# Patient Record
Sex: Male | Born: 1965 | Race: White | Hispanic: No | Marital: Married | Smoking: Former smoker
Health system: Southern US, Community
[De-identification: ages and names within clinical notes are randomized; demographics above are authoritative.]

## PROBLEM LIST (undated history)

## (undated) DIAGNOSIS — M1990 Unspecified osteoarthritis, unspecified site: Secondary | ICD-10-CM

## (undated) DIAGNOSIS — K219 Gastro-esophageal reflux disease without esophagitis: Secondary | ICD-10-CM

## (undated) DIAGNOSIS — D649 Anemia, unspecified: Secondary | ICD-10-CM

## (undated) DIAGNOSIS — R7303 Prediabetes: Secondary | ICD-10-CM

## (undated) DIAGNOSIS — I10 Essential (primary) hypertension: Secondary | ICD-10-CM

## (undated) HISTORY — PX: TONSILLECTOMY: SUR1361

## (undated) HISTORY — DX: Unspecified osteoarthritis, unspecified site: M19.90

## (undated) HISTORY — DX: Essential (primary) hypertension: I10

## (undated) HISTORY — PX: ADENOIDECTOMY: SUR15

## (undated) HISTORY — PX: FINGER AMPUTATION: SHX636

## (undated) HISTORY — DX: Gastro-esophageal reflux disease without esophagitis: K21.9

---

## 2000-11-29 ENCOUNTER — Emergency Department (HOSPITAL_COMMUNITY): Admission: EM | Admit: 2000-11-29 | Discharge: 2000-11-29 | Payer: Self-pay | Admitting: Internal Medicine

## 2011-12-17 ENCOUNTER — Ambulatory Visit: Payer: Federal, State, Local not specified - PPO

## 2011-12-17 ENCOUNTER — Ambulatory Visit (INDEPENDENT_AMBULATORY_CARE_PROVIDER_SITE_OTHER): Payer: Federal, State, Local not specified - PPO | Admitting: Family Medicine

## 2011-12-17 VITALS — BP 142/95 | HR 80 | Temp 98.9°F | Resp 18 | Ht 73.75 in | Wt 321.0 lb

## 2011-12-17 DIAGNOSIS — J9801 Acute bronchospasm: Secondary | ICD-10-CM

## 2011-12-17 DIAGNOSIS — R062 Wheezing: Secondary | ICD-10-CM

## 2011-12-17 DIAGNOSIS — R05 Cough: Secondary | ICD-10-CM

## 2011-12-17 DIAGNOSIS — D7289 Other specified disorders of white blood cells: Secondary | ICD-10-CM

## 2011-12-17 DIAGNOSIS — J40 Bronchitis, not specified as acute or chronic: Secondary | ICD-10-CM

## 2011-12-17 LAB — POCT CBC
Lymph, poc: 2 (ref 0.6–3.4)
MCHC: 32.1 g/dL (ref 31.8–35.4)
MPV: 8.3 fL (ref 0–99.8)
POC Granulocyte: 11 — AB (ref 2–6.9)
POC LYMPH PERCENT: 14.3 %L (ref 10–50)
POC MID %: 5.3 %M (ref 0–12)
Platelet Count, POC: 360 10*3/uL (ref 142–424)
RDW, POC: 13.4 %

## 2011-12-17 MED ORDER — AZITHROMYCIN 250 MG PO TABS: ORAL_TABLET | ORAL | Status: DC

## 2011-12-17 MED ORDER — HYDROCODONE-HOMATROPINE 5-1.5 MG/5ML PO SYRP: ORAL_SOLUTION | ORAL | Status: AC

## 2011-12-17 MED ORDER — IPRATROPIUM BROMIDE 0.02 % IN SOLN
0.5000 mg | Freq: Once | RESPIRATORY_TRACT | Status: AC
  Administered 2011-12-17: 0.5 mg via RESPIRATORY_TRACT

## 2011-12-17 MED ORDER — ALBUTEROL SULFATE (2.5 MG/3ML) 0.083% IN NEBU
2.5000 mg | INHALATION_SOLUTION | Freq: Once | RESPIRATORY_TRACT | Status: AC
  Administered 2011-12-17: 2.5 mg via RESPIRATORY_TRACT

## 2011-12-17 MED ORDER — ALBUTEROL SULFATE HFA 108 (90 BASE) MCG/ACT IN AERS: 2.0000 | INHALATION_SPRAY | RESPIRATORY_TRACT | Status: DC | PRN

## 2011-12-17 MED ORDER — BECLOMETHASONE DIPROPIONATE 40 MCG/ACT IN AERS: 1.0000 | INHALATION_SPRAY | Freq: Two times a day (BID) | RESPIRATORY_TRACT | Status: DC

## 2011-12-17 NOTE — Progress Notes (Signed)
  Subjective:    Patient ID: George Hays, male    DOB: October 09, 1965, 46 y.o.   MRN: [id number]  HPI    Review of Systems     Objective:   Physical Exam        Assessment & Plan:  Xray read and patient discussed with Eula Listen, PA-C. Agree with assessment and plan of care per his note.

## 2011-12-17 NOTE — Progress Notes (Signed)
Patient ID: George Hays MRN: [id number], DOB: [birth year], 46 y.o. Date of Encounter: 12/17/2011, 11:47 AM  Primary Physician: No primary provider on file.  Chief Complaint:  Chief Complaint  Patient presents with  . Nasal Congestion  . Wheezing  . Cough    HPI: 46 y.o. year old male presents with a two day history of nasal congestion, post nasal drip, sore throat, and cough. Sinus pressure left maxillary sinus. Afebrile. No chills. Nasal congestion thick and yellow. Cough is mildly productive of yellow sputum and worse as the day progresses. Now with some wheezing this morning. No history of asthma. No chest pain, or SOB. Normal hearing. Has tried OTC cold preps without success. No GI complaints. Appetite normal.  No sick contacts, recent antibiotics, or recent travels.   No leg trauma, sedentary periods, h/o cancer, or tobacco use.  No past medical history on file.   Home Meds: Prior to Admission medications   Medication Sig Start Date End Date Taking? Authorizing Provider  aspirin 81 MG tablet Take 81 mg by mouth daily.   Yes Historical Provider, MD  esomeprazole (NEXIUM) 40 MG capsule Take 40 mg by mouth daily before breakfast.   Yes Historical Provider, MD    Allergies: No Known Allergies  History   Social History  . Marital Status: Single    Spouse Name: N/A    Number of Children: N/A  . Years of Education: N/A   Occupational History  . Not on file.   Social History Main Topics  . Smoking status: Current Some Day Smoker  . Smokeless tobacco: Not on file  . Alcohol Use: Not on file  . Drug Use: Not on file  . Sexually Active: Not on file   Other Topics Concern  . Not on file   Social History Narrative  . No narrative on file     Review of Systems: Constitutional: negative for chills, fever, night sweats or weight changes Cardiovascular: negative for chest pain or palpitations Respiratory: negative for hemoptysis, or shortness of breath Abdominal:  negative for abdominal pain, nausea, vomiting or diarrhea Dermatological: negative for rash Neurologic: negative for headache   Physical Exam: Blood pressure 142/95, pulse 80, temperature 98.9 F (37.2 C), temperature source Oral, resp. rate 18, height 6' 1.75" (1.873 m), weight 321 lb (145.605 kg), SpO2 97.00%., Body mass index is 41.49 kg/(m^2). General: Well developed, well nourished, in no acute distress. Head: Normocephalic, atraumatic, eyes without discharge, sclera non-icteric, nares are congested. Bilateral auditory canals clear, TM's are without perforation, pearly grey with reflective cone of light bilaterally. No sinus TTP. Oral cavity moist, dentition normal. Posterior pharynx with post nasal drip and mild erythema. No peritonsillar abscess or tonsillar exudate. Neck: Supple. No thyromegaly. Full ROM. No lymphadenopathy. Lungs: Coarse breath sounds bilaterally with mild wheezes bilaterally. No rales or rhonchi. Breathing is unlabored. S/P Albuterol and Atrovent nebs: Patient feels much better. Lungs are CTAB. Heart: RRR with S1 S2. No murmurs, rubs, or gallops appreciated. Msk:  Strength and tone normal for age. Extremities: No clubbing or cyanosis. No edema. Neuro: Alert and oriented X 3. Moves all extremities spontaneously. CNII-XII grossly in tact. Psych:  Responds to questions appropriately with a normal affect.   Labs: Results for orders placed in visit on 12/17/11  POCT CBC      Component Value Range   WBC 13.7 (*) 4.6 - 10.2 K/uL   Lymph, poc 2.0  0.6 - 3.4   POC LYMPH PERCENT 14.3  10 - 50 %L   MID (cbc) 0.7  0 - 0.9   POC MID % 5.3  0 - 12 %M   POC Granulocyte 11.0 (*) 2 - 6.9   Granulocyte percent 80.4 (*) 37 - 80 %G   RBC 4.95  4.69 - 6.13 M/uL   Hemoglobin 14.0 (*) 14.1 - 18.1 g/dL   HCT, POC 09.8  11.9 - 53.7 %   MCV 88.1  80 - 97 fL   MCH, POC 28.3  27 - 31.2 pg   MCHC 32.1  31.8 - 35.4 g/dL   RDW, POC 14.7     Platelet Count, POC 360  142 - 424 K/uL    MPV 8.3  0 - 99.8 fL   [facility] reading (PRIMARY) by  Dr. Neva Seat. Increased perihilar markings with borderline cardiomegaly vs secondary to decreased inspiration  ASSESSMENT AND PLAN:  46 y.o. year old male with bronchitis and bronchospasm.  -Azithromycin 250 MG #6 2 po first day then 1 po next 4 days no RF -Hycodan #4oz 1 tsp po q 4-6 hours prn cough no RF SED -Proventil 2 puffs inhaled q 4-6 hours prn #1 RF 1 -Qvar 40 mcg 1 puff inhaled bid #1 RF 1 -Mucinex -Tylenol/Motrin prn -Rest/fluids -RTC precautions -RTC 3-5 days if no improvement  Signed, Eula Listen, PA-C 12/17/2011 11:47 AM

## 2011-12-20 ENCOUNTER — Telehealth: Payer: Self-pay

## 2011-12-20 MED ORDER — MOXIFLOXACIN HCL 400 MG PO TABS: 400.0000 mg | ORAL_TABLET | Freq: Every day | ORAL | Status: AC

## 2011-12-20 MED ORDER — CEFDINIR 300 MG PO CAPS: 600.0000 mg | ORAL_CAPSULE | Freq: Every day | ORAL | Status: DC

## 2011-12-20 NOTE — Telephone Encounter (Signed)
Spoke with patient re: new abx-- he states that he has never taken Omnicef before and is hesitant to do so-- would instead like to have Avelox rx'd.  Only contact patient if unable to rx Avelox.

## 2011-12-20 NOTE — Telephone Encounter (Signed)
Patient's anti-biotics not working for last 4 days, wants "next step up" called in to [facility]. Please call patient and advise if we can do this.

## 2011-12-20 NOTE — Telephone Encounter (Signed)
Spoke with patient--he states that he is taking all meds as directed, included Mucinex 1200 bid--and is still struggling with chest congestion/yellow mucus.  Breathing has improved a little, and sinus congestion not as severe.  Can we recommend anything else to help?

## 2011-12-20 NOTE — Telephone Encounter (Signed)
...  ok...  Avelox Rx'd

## 2011-12-20 NOTE — Telephone Encounter (Signed)
rx for Cefdinir sent

## 2012-07-22 ENCOUNTER — Ambulatory Visit (INDEPENDENT_AMBULATORY_CARE_PROVIDER_SITE_OTHER): Payer: Federal, State, Local not specified - PPO | Admitting: Emergency Medicine

## 2012-07-22 VITALS — BP 145/90 | HR 78 | Temp 97.9°F | Resp 17 | Ht 74.0 in | Wt 318.0 lb

## 2012-07-22 DIAGNOSIS — J340 Abscess, furuncle and carbuncle of nose: Secondary | ICD-10-CM

## 2012-07-22 DIAGNOSIS — J3489 Other specified disorders of nose and nasal sinuses: Secondary | ICD-10-CM

## 2012-07-22 MED ORDER — SULFAMETHOXAZOLE-TRIMETHOPRIM 800-160 MG PO TABS: 1.0000 | ORAL_TABLET | Freq: Two times a day (BID) | ORAL | Status: DC

## 2012-07-22 NOTE — Progress Notes (Signed)
[facility] [address] [place] [address] [phone]  Date:  07/22/2012   Name:  George Hays   DOB:  08/06/1965   MRN:  [id number]  PCP:  No primary provider on file.    Chief Complaint: pain at tip of nose   History of Present Illness:  George Hays is a 47 y.o. very pleasant male patient who presents with the following:  Has been blowing his nose with a cold.  Now has an extraordinarily tender and painful tip of his nose which is swollen and red.  No discharge.  No fever or chills.  There is no problem list on file for this patient.   Past Medical History  Diagnosis Date  . GERD (gastroesophageal reflux disease)     chronic indigestion    History reviewed. No pertinent past surgical history.  History  Substance Use Topics  . Smoking status: Current Some Day Smoker  . Smokeless tobacco: Never Used  . Alcohol Use: Yes     Comment: social    History reviewed. No pertinent family history.  No Known Allergies  Medication list has been reviewed and updated.  Current Outpatient Prescriptions on File Prior to Visit  Medication Sig Dispense Refill  . esomeprazole (NEXIUM) 40 MG capsule Take 40 mg by mouth daily before breakfast.      . albuterol (PROVENTIL HFA;VENTOLIN HFA) 108 (90 BASE) MCG/ACT inhaler Inhale 2 puffs into the lungs every 4 (four) hours as needed for wheezing.  1 Inhaler  1  . aspirin 81 MG tablet Take 81 mg by mouth daily.      . beclomethasone (QVAR) 40 MCG/ACT inhaler Inhale 1 puff into the lungs 2 (two) times daily.  1 Inhaler  1    Review of Systems:  As per HPI, otherwise negative.    Physical Examination: Filed Vitals:   07/22/12 1910  BP: 145/90  Pulse: 78  Temp: 97.9 F (36.6 C)  Resp: 17   Filed Vitals:   07/22/12 1910  Height: 6\' 2"  (1.88 m)  Weight: 318 lb (144.244 kg)   Body mass index is 40.83 kg/(m^2). Ideal Body Weight: Weight in (lb) to have BMI = 25: 194.3   GEN: WDWN, NAD, Non-toxic,  A & O x 3 HEENT: Atraumatic, Normocephalic. Neck supple. No masses, No LAD. Ears and Nose: No external deformity.  No drainage.   CV: RRR, No M/G/R. No JVD. No thrill. No extra heart sounds. PULM: CTA B, no wheezes, crackles, rhonchi. No retractions. No resp. distress. No accessory muscle use. ABD: S, NT, ND, +BS. No rebound. No HSM. EXTR: No c/c/e NEURO Normal gait.  PSYCH: Normally interactive. Conversant. Not depressed or anxious appearing.  Calm demeanor.    Assessment and Plan: Cellulitis nose Septra ds Follow up as needed  Carmelina Dane, MD

## 2013-01-22 ENCOUNTER — Emergency Department (HOSPITAL_COMMUNITY)
Admission: EM | Admit: 2013-01-22 | Discharge: 2013-01-23 | Disposition: A | Discharge status: Home / Self Care | Payer: Federal, State, Local not specified - PPO | Attending: Emergency Medicine | Admitting: Emergency Medicine

## 2013-01-22 ENCOUNTER — Other Ambulatory Visit: Payer: Self-pay

## 2013-01-22 ENCOUNTER — Encounter (HOSPITAL_COMMUNITY): Payer: Self-pay | Admitting: *Deleted

## 2013-01-22 DIAGNOSIS — K219 Gastro-esophageal reflux disease without esophagitis: Secondary | ICD-10-CM | POA: Insufficient documentation

## 2013-01-22 DIAGNOSIS — Z7982 Long term (current) use of aspirin: Secondary | ICD-10-CM | POA: Insufficient documentation

## 2013-01-22 DIAGNOSIS — F17200 Nicotine dependence, unspecified, uncomplicated: Secondary | ICD-10-CM | POA: Insufficient documentation

## 2013-01-22 DIAGNOSIS — Z79899 Other long term (current) drug therapy: Secondary | ICD-10-CM | POA: Insufficient documentation

## 2013-01-22 DIAGNOSIS — R0609 Other forms of dyspnea: Secondary | ICD-10-CM | POA: Insufficient documentation

## 2013-01-22 DIAGNOSIS — R0989 Other specified symptoms and signs involving the circulatory and respiratory systems: Secondary | ICD-10-CM | POA: Insufficient documentation

## 2013-01-22 DIAGNOSIS — R0600 Dyspnea, unspecified: Secondary | ICD-10-CM

## 2013-01-22 NOTE — ED Notes (Signed)
Pt. C/o SOB x2 days. Reports worse when laying down. With chest tightness, nausea, and dizziness. Pt. States "I just feel out of it".

## 2013-01-22 NOTE — ED Notes (Signed)
Pt states SOB since last that is exacerbated by laying down, with chest tightness, and nausea. Denies diaphoresis. Relived by sitting up.

## 2013-01-22 NOTE — ED Provider Notes (Signed)
CSN: [id number]     Arrival date & time 01/22/13  2318 History     First MD Initiated Contact with Patient 01/22/13 2330     Chief Complaint  Patient presents with  . Shortness of Breath   (Consider location/radiation/quality/duration/timing/severity/associated sxs/prior Treatment) Patient is a 47 y.o. male presenting with shortness of breath. The history is provided by the patient.  Shortness of Breath Associated symptoms: no abdominal pain, no chest pain, no headaches, no rash and no vomiting    patient presents with shortness of breath. He states is worse with lying down. He also states he feels slightly off. No cough. He is able to do the same physical activity. No fevers. No abdominal pain. No swelling in his legs. No recent viral illness. No chest pain.  Past Medical History  Diagnosis Date  . GERD (gastroesophageal reflux disease)     chronic indigestion   No past surgical history on file. No family history on file. History  Substance Use Topics  . Smoking status: Current Some Day Smoker  . Smokeless tobacco: Never Used  . Alcohol Use: Yes     Comment: social    Review of Systems  Constitutional: Negative for activity change and appetite change.  HENT: Negative for neck stiffness.   Eyes: Negative for pain.  Respiratory: Positive for shortness of breath. Negative for chest tightness.   Cardiovascular: Negative for chest pain and leg swelling.  Gastrointestinal: Negative for nausea, vomiting, abdominal pain and diarrhea.  Genitourinary: Negative for flank pain.  Musculoskeletal: Negative for back pain.  Skin: Negative for rash.  Neurological: Negative for weakness, numbness and headaches.  Psychiatric/Behavioral: Negative for behavioral problems.    Allergies  Review of patient's allergies indicates no known allergies.  Home Medications   Current Outpatient Rx  Name  Route  Sig  Dispense  Refill  . aspirin 81 MG tablet   Oral   Take 81 mg by mouth daily.          Marland Kitchen esomeprazole (NEXIUM) 40 MG capsule   Oral   Take 40 mg by mouth daily before breakfast.          BP 115/55  Pulse 63  Temp(Src) 97.4 F (36.3 C) (Oral)  Resp 18  SpO2 98% Physical Exam  Nursing note and vitals reviewed. Constitutional: He is oriented to person, place, and time. He appears well-developed and well-nourished.  Patient is obese  HENT:  Head: Normocephalic and atraumatic.  Eyes: EOM are normal. Pupils are equal, round, and reactive to light.  Neck: Normal range of motion. Neck supple.  Cardiovascular: Normal rate, regular rhythm and normal heart sounds.   No murmur heard. Pulmonary/Chest: Effort normal and breath sounds normal.  Abdominal: Soft. Bowel sounds are normal. He exhibits no distension and no mass. There is no tenderness. There is no rebound and no guarding.  Musculoskeletal: Normal range of motion. He exhibits no edema.  Neurological: He is alert and oriented to person, place, and time. No cranial nerve deficit.  Skin: Skin is warm and dry.  Psychiatric: He has a normal mood and affect.    ED Course   Procedures (including critical care time)  Labs Reviewed  CBC - Abnormal; Notable for the following:    Hemoglobin 12.7 (*)    HCT 37.7 (*)    All other components within normal limits  BASIC METABOLIC PANEL - Abnormal; Notable for the following:    GFR calc non Af Amer 69 (*)    GFR  calc Af Amer 80 (*)    All other components within normal limits  PRO B NATRIURETIC PEPTIDE  POCT I-STAT TROPONIN I   Dg Chest 2 View  01/23/2013   *RADIOLOGY REPORT*  Clinical Data: Shortness of breath.  CHEST - 2 VIEW  Comparison: PA and lateral chest 12/17/2011.  Findings: The lungs are clear.  Heart size is normal.  No pneumothorax or pleural fluid.  No focal bony abnormality.  IMPRESSION: No acute disease.   Original Report Authenticated By: Holley Dexter, M.D.   1. Dyspnea     Date: 01/23/2013  Rate: 69  Rhythm: normal sinus rhythm  QRS Axis:  normal  Intervals: normal  ST/T Wave abnormalities: nonspecific T wave changes  Conduction Disutrbances:none  Narrative Interpretation:   Old EKG Reviewed: none available   MDM  Patient with dyspnea, worse at night and laying down. May have some sleep apnea. EKG is reassuring. Chest x-ray is reassuring. Doubt pulmonary embolism. He will be discharged home to follow with pulmonary.    Juliet Rude. Rubin Payor, MD 01/23/13 0730

## 2013-01-23 ENCOUNTER — Emergency Department (HOSPITAL_COMMUNITY): Payer: Federal, State, Local not specified - PPO

## 2013-01-23 LAB — POCT I-STAT TROPONIN I

## 2013-01-23 LAB — CBC
HCT: 37.7 % — ABNORMAL LOW (ref 39.0–52.0)
Hemoglobin: 12.7 g/dL — ABNORMAL LOW (ref 13.0–17.0)
MCH: 29.3 pg (ref 26.0–34.0)
MCHC: 33.7 g/dL (ref 30.0–36.0)
MCV: 86.9 fL (ref 78.0–100.0)
RDW: 12.7 % (ref 11.5–15.5)

## 2013-01-23 LAB — BASIC METABOLIC PANEL
BUN: 10 mg/dL (ref 6–23)
Calcium: 8.8 mg/dL (ref 8.4–10.5)
Creatinine, Ser: 1.23 mg/dL (ref 0.50–1.35)
GFR calc Af Amer: 80 mL/min — ABNORMAL LOW (ref >90)
GFR calc non Af Amer: 69 mL/min — ABNORMAL LOW (ref >90)
Glucose, Bld: 90 mg/dL (ref 70–99)
Potassium: 4.2 mEq/L (ref 3.5–5.1)

## 2013-02-28 ENCOUNTER — Institutional Professional Consult (permissible substitution): Payer: Federal, State, Local not specified - PPO | Admitting: Pulmonary Disease

## 2013-03-10 ENCOUNTER — Ambulatory Visit (INDEPENDENT_AMBULATORY_CARE_PROVIDER_SITE_OTHER): Payer: Federal, State, Local not specified - PPO | Admitting: Pulmonary Disease

## 2013-03-10 ENCOUNTER — Encounter: Payer: Self-pay | Admitting: Pulmonary Disease

## 2013-03-10 VITALS — BP 128/82 | HR 73 | Temp 98.1°F | Ht 73.75 in | Wt 317.8 lb

## 2013-03-10 DIAGNOSIS — G4733 Obstructive sleep apnea (adult) (pediatric): Secondary | ICD-10-CM | POA: Insufficient documentation

## 2013-03-10 DIAGNOSIS — R0609 Other forms of dyspnea: Secondary | ICD-10-CM

## 2013-03-10 NOTE — Progress Notes (Signed)
  Subjective:    Patient ID: George Hays, male    DOB: 06/24/1965, 47 y.o.   MRN: [id number]  HPI The patient is a 47 year old male who been asked to see for dyspnea.  He was recently in the emergency room for acute dyspnea, and nothing was found from a cardiopulmonary standpoint.  The patient states that he began to notice dyspnea on exertion in August of this year that came on fairly quickly, and it has been progressive.  He denies having a breathing problem prior to this.  He states that his shortness of breath is intermittent, to the point that some days he has no issues at all and others he has great difficulty.  He describes significant breathing issues while lying down, and has to prop up on pillows.  He describes shortness of breath with 2 flights of stairs, and also bringing groceries in from the car.  He is unable to estimate blocks.  He does not have any significant cough or sputum, but has had intermittent lower extremity edema.  The patient does have a history of smoking one to 2 packs per day on average for 15 years.  He has never had pulmonary function studies, and his most recent chest x-ray was totally clear.  Finally, the patient does note gasping episodes as he is falling asleep, as well as loud snoring and an abnormal breathing pattern during sleep.  He also has nonrestorative sleep and daytime sleepiness with inactivity.   Review of Systems  Constitutional: Negative for fever and unexpected weight change.  HENT: Positive for congestion. Negative for ear pain, nosebleeds, sore throat, rhinorrhea, sneezing, trouble swallowing, dental problem, postnasal drip and sinus pressure.   Eyes: Negative for redness and itching.  Respiratory: Positive for cough, chest tightness and shortness of breath. Negative for wheezing.   Cardiovascular: Positive for chest pain. Negative for palpitations and leg swelling.  Gastrointestinal: Negative for nausea and vomiting.  Genitourinary: Negative for  dysuria.  Musculoskeletal: Positive for joint swelling and arthralgias.  Skin: Negative for rash.  Neurological: Negative for headaches.  Hematological: Does not bruise/bleed easily.  Psychiatric/Behavioral: Negative for dysphoric mood. The patient is not nervous/anxious.        Objective:   Physical Exam Constitutional:  Obese male, no acute distress  HENT:  Nares patent without discharge  Oropharynx without exudate, palate and uvula are thickened and elongated.   Eyes:  Perrla, eomi, no scleral icterus  Neck:  No JVD, no TMG  Cardiovascular:  Normal rate, regular rhythm, no rubs or gallops.  No murmurs        Intact distal pulses  Pulmonary :  Normal breath sounds, no stridor or respiratory distress   No rales, rhonchi, or wheezing  Abdominal:  Soft, nondistended, bowel sounds present.  No tenderness noted.   Musculoskeletal:  No lower extremity edema noted.  Lymph Nodes:  No cervical lymphadenopathy noted  Skin:  No cyanosis noted  Neurologic:  Alert, appropriate, moves all 4 extremities without obvious deficit.         Assessment & Plan:

## 2013-03-10 NOTE — Patient Instructions (Addendum)
Will schedule for breathing studies over the next few weeks. Will schedule for home sleep testing to evaluate for sleep apnea.  Will arrange followup once the results are available.

## 2013-03-10 NOTE — Assessment & Plan Note (Signed)
The patient has significant dyspnea on exertion that he believes has occurred just since August of this year.  It came on fairly quickly, and has been progressive since that time.  He was evaluated in the ER with no acute findings.  He has a history of smoking, but it is not overly impressive.  He has no history of asthma, and his lungs are completely clear today.  We'll schedule for full pulmonary function studies, and he also has an appointment with the cardiologist to approach this from a cardiac standpoint.  In the end, I suspect this has been present for some time, and is primarily related to his obesity and deconditioning.

## 2013-03-10 NOTE — Assessment & Plan Note (Signed)
The patient gives a classic history for obstructive sleep apnea.  He is morbidly obese, has a large neck size, and describes gasping arousals with a nonrestorative sleep and daytime sleepiness.  He will need to have a sleep study, and I think he is a good candidate for home sleep testing.

## 2013-03-10 NOTE — Addendum Note (Signed)
Addended by: Maisie Fus on: 03/10/2013 05:15 PM   Modules accepted: Orders

## 2013-03-15 DIAGNOSIS — G4733 Obstructive sleep apnea (adult) (pediatric): Secondary | ICD-10-CM

## 2013-03-16 ENCOUNTER — Ambulatory Visit (INDEPENDENT_AMBULATORY_CARE_PROVIDER_SITE_OTHER): Payer: Federal, State, Local not specified - PPO | Admitting: Pulmonary Disease

## 2013-03-16 DIAGNOSIS — R0609 Other forms of dyspnea: Secondary | ICD-10-CM

## 2013-03-16 LAB — PULMONARY FUNCTION TEST

## 2013-03-16 NOTE — Progress Notes (Signed)
PFT done today. 

## 2013-03-21 ENCOUNTER — Telehealth: Payer: Self-pay | Admitting: Pulmonary Disease

## 2013-03-21 ENCOUNTER — Encounter: Payer: Self-pay | Admitting: Pulmonary Disease

## 2013-03-21 DIAGNOSIS — G4733 Obstructive sleep apnea (adult) (pediatric): Secondary | ICD-10-CM

## 2013-03-21 NOTE — Telephone Encounter (Signed)
Pt needs ov scheduled to review sleep study with him.

## 2013-03-22 NOTE — Telephone Encounter (Signed)
Let him know there is no copd or asthma, and mild restriction of his depth of inspiration due to his weight.  Do not see any specific cause for his sob.

## 2013-03-22 NOTE — Telephone Encounter (Signed)
Patient scheduled Oct 14 at 430 with [facility].  Pt requesting PFT results--completed 03/16/13 here at our office. Will ask Jerolyn Shin for these results, they are not in EPIC.

## 2013-03-23 NOTE — Telephone Encounter (Signed)
lmomtcb x1 for pt 

## 2013-03-23 NOTE — Telephone Encounter (Signed)
Called, spoke with pt.  Informed him of PFT results per [facility].  He verbalized understanding of this and will keep Oct OV with [facility].

## 2013-03-23 NOTE — Telephone Encounter (Signed)
Returning call can be reached at [phone].George Hays

## 2013-03-24 ENCOUNTER — Encounter: Payer: Self-pay | Admitting: Cardiology

## 2013-03-24 ENCOUNTER — Ambulatory Visit (INDEPENDENT_AMBULATORY_CARE_PROVIDER_SITE_OTHER): Payer: Federal, State, Local not specified - PPO | Admitting: Cardiology

## 2013-03-24 VITALS — BP 139/92 | HR 80 | Ht 73.75 in | Wt 314.0 lb

## 2013-03-24 DIAGNOSIS — R9431 Abnormal electrocardiogram [ECG] [EKG]: Secondary | ICD-10-CM

## 2013-03-24 DIAGNOSIS — G4733 Obstructive sleep apnea (adult) (pediatric): Secondary | ICD-10-CM

## 2013-03-24 NOTE — Patient Instructions (Addendum)
The current medical regimen is effective;  continue present plan and medications.  Follow up as needed 

## 2013-03-24 NOTE — Progress Notes (Signed)
HPI The patient presents for followup from an emergency room visit in early August. I have reviewed all of these records. He came to the hospital after an acute episode of shortness of breath that woke him from his sleep. He had to sit up gasping for breath but was able to breathe when he did this. He had never had an episode like this before. He wasn't sure that his heart was racing. He didn't have any chest pressure, neck or arm discomfort. He had felt well prior to that. He's not had any episodes since then. He denies any chest pressure, neck or arm discomfort. He's had no further palpitations, presyncope or syncope. He started dieting and has lost a few pounds. He has not started exercising however.  No Known Allergies  Current Outpatient Prescriptions  Medication Sig Dispense Refill  . aspirin 81 MG tablet Take 81 mg by mouth daily.      Marland Kitchen esomeprazole (NEXIUM) 40 MG capsule Take 40 mg by mouth daily before breakfast.       No current facility-administered medications for this visit.    Past Medical History  Diagnosis Date  . GERD (gastroesophageal reflux disease)     chronic indigestion    Past Surgical History  Procedure Laterality Date  . Tonsillectomy    . Adenoidectomy    . 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 Allergies Other   . Skin cancer Paternal Grandfather   . Lung cancer Paternal Grandmother   . Cancer Other     great grandparents and aunts/uncles    History   Social History  . Marital Status: Single    Spouse Name: N/A    Number of Children: N/A  . Years of Education: N/A   Occupational History  . Publishing rights manager    Social History Main Topics  . Smoking status: Passive Smoke Exposure - Never Smoker -- 4.00 packs/day for 15 years    Types: Cigarettes, Cigars    Last Attempt to Quit: 06/22/2002  . Smokeless tobacco: Never Used     Comment:  Currently smokes 2-3 cigars a year. FOR 8 YEARS PT SMOKED 3-4 PPD  . Alcohol Use: Yes     Comment: social  . Drug Use: No  . Sexual Activity: Not on file   Other Topics Concern  . Not on file   Social History Narrative  . No narrative on file    ROS:  As stated in the HPI and negative for all other systems.  PHYSICAL EXAM BP 139/92  Pulse 80  Ht 6' 1.75" (1.873 m)  Wt 314 lb (142.429 kg)  BMI 40.6 kg/m2 GENERAL:  Well appearing HEENT:  Pupils equal round and reactive, fundi not visualized, oral mucosa unremarkable NECK:  No jugular venous distention, waveform within normal limits, carotid upstroke brisk and symmetric, no bruits, no thyromegaly LYMPHATICS:  No cervical, inguinal adenopathy LUNGS:  Clear to auscultation bilaterally BACK:  No CVA tenderness CHEST:  Unremarkable HEART:  PMI not displaced or sustained,S1 and S2 within normal limits, no S3, no S4, no clicks, no rubs, no murmurs ABD:  Flat, positive bowel sounds normal in frequency in pitch, no bruits, no rebound, no guarding, no midline pulsatile mass, no hepatomegaly, no splenomegaly EXT:  2 plus pulses throughout, no edema, no cyanosis no clubbing SKIN:  No rashes no nodules NEURO:  Cranial  nerves II through XII grossly intact, motor grossly intact throughout [facility]:  Cognitively intact, oriented to person place and time  EKG:  Unusual P wave axis possible ectopic atrial rhythm. Rate 72, axis within normal limits, intervals  Within normal limits, no acute ST-T wave changes. 03/24/2013  ASSESSMENT AND PLAN  DYSPNEA:  He has had no recurrence of this dysrhythmia. No further evaluation is planned. At this point he would let me know if this recurs or if he has worsening shortness of breath as he loses weight and exercises. This could have been an acute arrhythmia but says he's only had one episode further monitoring is indicated. It is possible it was also related to sleep apnea and this is being evaluated and will be  managed by Dr. Shelle Iron  OBESITY:  The patient understands the need to lose weight with diet and exercise. We have discussed specific strategies for this.

## 2013-04-04 ENCOUNTER — Ambulatory Visit (INDEPENDENT_AMBULATORY_CARE_PROVIDER_SITE_OTHER): Payer: Federal, State, Local not specified - PPO | Admitting: Pulmonary Disease

## 2013-04-04 ENCOUNTER — Encounter: Payer: Self-pay | Admitting: Pulmonary Disease

## 2013-04-04 VITALS — BP 112/82 | HR 80 | Temp 98.4°F | Ht 74.0 in | Wt 317.4 lb

## 2013-04-04 DIAGNOSIS — G4733 Obstructive sleep apnea (adult) (pediatric): Secondary | ICD-10-CM

## 2013-04-04 NOTE — Assessment & Plan Note (Signed)
The patient has moderate obstructive sleep apnea, but is clearly symptomatic but that night and during the day.  I have reviewed the various options with him, including a trial of weight loss, upper airway surgery, dental appliance, and also CPAP.  I feel strongly that CPAP coupled with weight loss as his best treatment, and the patient is willing to give it a try. I will set the patient up on cpap at a moderate pressure level to allow for desensitization, and will troubleshoot the device over the next 4-6weeks if needed.  The pt is to call me if having issues with tolerance.  Will then optimize the pressure once patient is able to wear cpap on a consistent basis.

## 2013-04-04 NOTE — Progress Notes (Signed)
  Subjective:    Patient ID: George Hays, male    DOB: 1965-12-13, 47 y.o.   MRN: [id number]  HPI Patient comes in today for followup after his recent sleep test.  This showed moderate OSA, with an AHI of 22 events per hour.  I have reviewed the study with him in detail, and answered all of his questions.   Review of Systems  Constitutional: Negative for fever and unexpected weight change.  HENT: Negative for congestion, dental problem, ear pain, nosebleeds, postnasal drip, rhinorrhea, sinus pressure, sneezing, sore throat and trouble swallowing.   Eyes: Negative for redness and itching.  Respiratory: Negative for cough, chest tightness, shortness of breath and wheezing.   Cardiovascular: Negative for palpitations and leg swelling.  Gastrointestinal: Negative for nausea and vomiting.  Genitourinary: Negative for dysuria.  Musculoskeletal: Negative for joint swelling.  Skin: Negative for rash.  Neurological: Negative for headaches.  Hematological: Does not bruise/bleed easily.  Psychiatric/Behavioral: Negative for dysphoric mood. The patient is not nervous/anxious.        Objective:   Physical Exam Overweight male in no acute distress Nose without purulence or discharge noted Neck without lymphadenopathy or thyromegaly Lower extremities with mild edema, no cyanosis Alert and oriented, moves all 4 extremities.        Assessment & Plan:

## 2013-04-04 NOTE — Patient Instructions (Signed)
Will start you on cpap at a moderate level.  Please call if having tolerance issues. Work on weight loss. followup with me in 6 weeks.

## 2013-04-21 ENCOUNTER — Encounter: Payer: Self-pay | Admitting: Pulmonary Disease

## 2013-05-16 ENCOUNTER — Encounter: Payer: Self-pay | Admitting: Pulmonary Disease

## 2013-05-16 ENCOUNTER — Ambulatory Visit (INDEPENDENT_AMBULATORY_CARE_PROVIDER_SITE_OTHER): Payer: Federal, State, Local not specified - PPO | Admitting: Pulmonary Disease

## 2013-05-16 VITALS — BP 118/80 | HR 78 | Temp 98.3°F | Ht 74.0 in | Wt 321.0 lb

## 2013-05-16 DIAGNOSIS — G4733 Obstructive sleep apnea (adult) (pediatric): Secondary | ICD-10-CM

## 2013-05-16 NOTE — Progress Notes (Signed)
  Subjective:    Patient ID: George Hays, male    DOB: 04/10/1966, 47 y.o.   MRN: [id number]  HPI Patient comes in today for followup of his obstructive sleep apnea. He was started on CPAP at the last visit, and overall has done very well with the device. He has seen improvement in his sleep and daytime alertness, but 2 nights a week he will pull the mask off in his sleep and not replace it. He thinks this is because of turning with his face in the pillow.    Review of Systems  Constitutional: Negative for fever and unexpected weight change.  HENT: Negative for congestion, dental problem, ear pain, nosebleeds, postnasal drip, rhinorrhea, sinus pressure, sneezing, sore throat and trouble swallowing.   Eyes: Negative for redness and itching.  Respiratory: Negative for cough, chest tightness, shortness of breath and wheezing.   Cardiovascular: Negative for palpitations and leg swelling.  Gastrointestinal: Negative for nausea and vomiting.  Genitourinary: Negative for dysuria.  Musculoskeletal: Negative for joint swelling.  Skin: Negative for rash.  Neurological: Negative for headaches.  Hematological: Does not bruise/bleed easily.  Psychiatric/Behavioral: Negative for dysphoric mood. The patient is not nervous/anxious.        Objective:   Physical Exam Obese male in no acute distress Nose without purulence or discharge noted No skin breakdown or pressure necrosis from the CPAP mass Neck without lymphadenopathy or thyromegaly Lower extremities without edema, no cyanosis Alert, does not appear to be sleepy, moves all 4 extremities.       Assessment & Plan:

## 2013-05-16 NOTE — Patient Instructions (Signed)
Will have your machine put on auto setting for next few weeks, and will let you know results once I receive your download. Work on weight loss followup with me in 6mos, but call if mask issue continues.

## 2013-05-16 NOTE — Assessment & Plan Note (Signed)
The patient has been started on CPAP for his obstructive sleep apnea, and overall has done very well with the device. He does pull the mask off a few times a week, but when he wears all night he sees a significant improvement in his sleep and daytime alertness. At this point, we need to optimize his pressure, and I have asked him to give this a little bit more time regarding mask removal.

## 2013-06-08 ENCOUNTER — Encounter: Payer: Self-pay | Admitting: Family Medicine

## 2013-07-11 ENCOUNTER — Telehealth: Payer: Self-pay | Admitting: Pulmonary Disease

## 2013-07-11 NOTE — Telephone Encounter (Signed)
I have no cmn on this patient. Called [employer] and spoke with Rodena Piety, in re; to this, she has said Cmn is signed and cleared, there are no held sales on his account everything is cleared. She says pt may have called their billing office and they may have told him this, but everything is cleared.  I will call patient and tell him the same. George Hays

## 2013-07-11 NOTE — Telephone Encounter (Signed)
Spoke with patient.  Pt needing CMN signed before they will cover his CPAP equipment.   Will forward to Alida to advise if she has seen this CMN

## 2013-07-23 ENCOUNTER — Other Ambulatory Visit: Payer: Self-pay | Admitting: Pulmonary Disease

## 2013-07-23 DIAGNOSIS — G4733 Obstructive sleep apnea (adult) (pediatric): Secondary | ICD-10-CM

## 2013-11-14 ENCOUNTER — Ambulatory Visit (INDEPENDENT_AMBULATORY_CARE_PROVIDER_SITE_OTHER): Payer: Federal, State, Local not specified - PPO | Admitting: Pulmonary Disease

## 2013-11-14 ENCOUNTER — Encounter: Payer: Self-pay | Admitting: Pulmonary Disease

## 2013-11-14 VITALS — BP 130/78 | HR 74 | Temp 98.4°F | Ht 74.0 in | Wt 333.8 lb

## 2013-11-14 DIAGNOSIS — G4733 Obstructive sleep apnea (adult) (pediatric): Secondary | ICD-10-CM

## 2013-11-14 NOTE — Patient Instructions (Signed)
Will have your machine set on the auto setting. Keep working with your mask fit, but let me know if you wish to try a fitting session at the sleep center during the day.  Work on weight loss followup with me again in one year, but call me if you are continuing to struggle with the device.

## 2013-11-14 NOTE — Assessment & Plan Note (Signed)
The patient continues to have issues with his CPAP despite adequate pressure. It primarily centers around his mask fit, and I have offered to send him to the sleep Center for a fitting session. He would like to hold off on this for now, and we'll keep working with his current mask. I will set his machine on the auto setting to see if this tolerance improves. I've also discussed with him the possibility of a dental appliance, although it is not optimal treatment for his degree of sleep apnea. Finally, I have encouraged him to work aggressively on weight loss.

## 2013-11-14 NOTE — Progress Notes (Signed)
   Subjective:    Patient ID: George Hays, male    DOB: [birth year], 48 y.o.   MRN: [id number]  HPI Patient comes in today for followup of his obstructive sleep apnea. He has been wearing CPAP compliantly, but the last one to 2 months he has had difficulties keeping his mask on during the night. His most recent 30 day download shows only 7 nights out of 30 that he has worn the device, and only one of those nights more than 4 hours. He has no issues with the pressure, but pulls the mask off during sleep very commonly. He does note the mask leaks quite a bit, and does not have the finances to make change.  He clearly sees improvement in his sleep when he is able to wear the device.   Review of Systems  Constitutional: Negative for fever and unexpected weight change.  HENT: Negative for congestion, dental problem, ear pain, nosebleeds, postnasal drip, rhinorrhea, sinus pressure, sneezing, sore throat and trouble swallowing.   Eyes: Negative for redness and itching.  Respiratory: Negative for cough, chest tightness, shortness of breath and wheezing.   Cardiovascular: Negative for palpitations and leg swelling.  Gastrointestinal: Negative for nausea and vomiting.  Genitourinary: Negative for dysuria.  Musculoskeletal: Negative for joint swelling.  Skin: Negative for rash.  Neurological: Negative for headaches.  Hematological: Does not bruise/bleed easily.  Psychiatric/Behavioral: Negative for dysphoric mood. The patient is not nervous/anxious.        Objective:   Physical Exam Obese male in no acute distress Nose without purulence or discharge noted No skin breakdown or pressure necrosis from the CPAP mask Neck without lymphadenopathy or thyromegaly Lower extremities with mild edema, no cyanosis Alert and oriented, moves all 4 extremities.       Assessment & Plan:

## 2014-05-08 ENCOUNTER — Ambulatory Visit (INDEPENDENT_AMBULATORY_CARE_PROVIDER_SITE_OTHER): Payer: Federal, State, Local not specified - PPO | Admitting: Family Medicine

## 2014-05-08 VITALS — BP 138/96 | HR 83 | Temp 98.0°F | Resp 18 | Ht 73.5 in | Wt 318.6 lb

## 2014-05-08 DIAGNOSIS — R5381 Other malaise: Secondary | ICD-10-CM

## 2014-05-08 DIAGNOSIS — E669 Obesity, unspecified: Secondary | ICD-10-CM

## 2014-05-08 DIAGNOSIS — J029 Acute pharyngitis, unspecified: Secondary | ICD-10-CM

## 2014-05-08 DIAGNOSIS — R05 Cough: Secondary | ICD-10-CM

## 2014-05-08 DIAGNOSIS — R059 Cough, unspecified: Secondary | ICD-10-CM

## 2014-05-08 DIAGNOSIS — R0982 Postnasal drip: Secondary | ICD-10-CM

## 2014-05-08 LAB — POCT CBC
GRANULOCYTE PERCENT: 76.4 % (ref 37–80)
HCT, POC: 42.5 % — AB (ref 43.5–53.7)
Hemoglobin: 14 g/dL — AB (ref 14.1–18.1)
Lymph, poc: 1.6 (ref 0.6–3.4)
MCH: 28.8 pg (ref 27–31.2)
MCHC: 32.9 g/dL (ref 31.8–35.4)
MCV: 87.4 fL (ref 80–97)
MID (CBC): 0.6 (ref 0–0.9)
MPV: 6.6 fL (ref 0–99.8)
PLATELET COUNT, POC: 343 10*3/uL (ref 142–424)
POC Granulocyte: 7 — AB (ref 2–6.9)
POC LYMPH PERCENT: 17.3 %L (ref 10–50)
POC MID %: 6.3 % (ref 0–12)
RBC: 4.86 M/uL (ref 4.69–6.13)
RDW, POC: 13.3 %
WBC: 9.2 10*3/uL (ref 4.6–10.2)

## 2014-05-08 LAB — POCT RAPID STREP A (OFFICE): Rapid Strep A Screen: NEGATIVE

## 2014-05-08 MED ORDER — HYDROCODONE-HOMATROPINE 5-1.5 MG/5ML PO SYRP
5.0000 mL | ORAL_SOLUTION | Freq: Three times a day (TID) | ORAL | Status: DC | PRN
Start: 2014-05-08 — End: 2015-01-30

## 2014-05-08 MED ORDER — IPRATROPIUM BROMIDE 0.03 % NA SOLN: 2.0000 | Freq: Four times a day (QID) | NASAL | Status: DC

## 2014-05-08 NOTE — Progress Notes (Signed)
[facility] [address], [place] [address] [phone]  Date:  05/08/2014   Name:  George Hays   DOB:  1965/07/31   MRN:  [id number]  PCP:  Hayden Rasmussen., MD    Chief Complaint: Sinus Congestion; Sore Throat; and Cough   History of Present Illness:  George Hays is a 48 y.o. very pleasant male patient who presents with the following:  Here today with illness.  Generally healthy except for obesity and OSA; he sees Dr. Gwenette Greet for pulmonology issues, Dr. Percival Spanish for cardiology.   He has noted "sinus , running like a faucet for hte last 4 days or so.  Cough for a couple of days.  I've been sneezing too."  He has not checked a fever, but has not been feeling well.  He is taking dayquil and other OTC medications.  He now has a ST due to PND, and notes sinus pressure.   No GI symptoms. NKDA No sick contacts; his wife is OOT right now.   Patient Active Problem List   Diagnosis Date Noted  . DOE (dyspnea on exertion) 03/10/2013  . OSA (obstructive sleep apnea) 03/10/2013    Past Medical History  Diagnosis Date  . GERD (gastroesophageal reflux disease)     chronic indigestion    Past Surgical History  Procedure Laterality Date  . Tonsillectomy    . Adenoidectomy    . 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 Smoking status: Former Smoker -- 4.00 packs/day for 15 years    Types: Cigarettes, Cigars    Quit date: 06/22/2002  . Smokeless tobacco: Never Used     Comment: Currently smokes 2-3 cigars a year. FOR 8 YEARS PT SMOKED 3-4 PPD  . Alcohol Use: Yes     Comment: social    Family History  Problem Relation Age of Onset  . Allergies Other   . Skin cancer Paternal Grandfather   . Lung cancer Paternal Grandmother   . Cancer Other     great grandparents and aunts/uncles  . Diabetes Mother     No Known Allergies  Medication list has been reviewed and  updated.  Current Outpatient Prescriptions on File Prior to Visit  Medication Sig Dispense Refill  . aspirin 81 MG tablet Take 81 mg by mouth daily.    Marland Kitchen esomeprazole (NEXIUM) 40 MG capsule Take 40 mg by mouth daily before breakfast.     No current facility-administered medications on file prior to visit.    Review of Systems:  As per HPI- otherwise negative.   Physical Examination: Filed Vitals:   05/08/14 0810  BP: 138/96  Pulse: 83  Temp: 98 F (36.7 C)  Resp: 18   Filed Vitals:   05/08/14 0810  Height: 6' 1.5" (1.867 m)  Weight: 318 lb 9.6 oz (144.516 kg)   Body mass index is 41.46 kg/(m^2). Ideal Body Weight: Weight in (lb) to have BMI = 25: 191.7  GEN: WDWN, NAD, Non-toxic, A & O x 3, obese HEENT: Atraumatic, Normocephalic. Neck supple. No masses, No LAD.  Bilateral TM wnl,  PEERL,EOMI.   Nasal congestion and drainage, throat injected but no exudate Ears and Nose: No external deformity. CV: RRR, No M/G/R. No JVD. No thrill. No extra heart sounds. PULM: CTA B, no wheezes, crackles, rhonchi. No retractions. No resp. distress. No accessory muscle use. EXTR: No  c/c/e NEURO Normal gait.  PSYCH: Normally interactive. Conversant. Not depressed or anxious appearing.  Calm demeanor.   Results for orders placed or performed in visit on 05/08/14  POCT CBC  Result Value Ref Range   WBC 9.2 4.6 - 10.2 K/uL   Lymph, poc 1.6 0.6 - 3.4   POC LYMPH PERCENT 17.3 10 - 50 %L   MID (cbc) 0.6 0 - 0.9   POC MID % 6.3 0 - 12 %M   POC Granulocyte 7.0 (A) 2 - 6.9   Granulocyte percent 76.4 37 - 80 %G   RBC 4.86 4.69 - 6.13 M/uL   Hemoglobin 14.0 (A) 14.1 - 18.1 g/dL   HCT, POC 42.5 (A) 43.5 - 53.7 %   MCV 87.4 80 - 97 fL   MCH, POC 28.8 27 - 31.2 pg   MCHC 32.9 31.8 - 35.4 g/dL   RDW, POC 13.3 %   Platelet Count, POC 343 142 - 424 K/uL   MPV 6.6 0 - 99.8 fL  POCT rapid strep A  Result Value Ref Range   Rapid Strep A Screen Negative Negative    Assessment and Plan: Acute  pharyngitis, unspecified pharyngitis type - Plan: POCT rapid strep A  Obesity  PND (post-nasal drip) - Plan: ipratropium (ATROVENT) 0.03 % nasal spray  Cough - Plan: POCT CBC, HYDROcodone-homatropine (HYCODAN) 5-1.5 MG/5ML syrup  Malaise - Plan: POCT CBC  Treat for viral URI as above.   See patient instructions for more details.     Signed Lamar Blinks, MD

## 2014-05-08 NOTE — Patient Instructions (Signed)
It looks like you have the bad cold that has been going around.  Try the atrovent nasal for your nasal drainage and the cough syrup as needed Remember the syrup will make you sleepy Rest and drink plenty of fluids.  Let me know if you do not feel better in the next 2-3 days   You are just minimally anemic today.  Please have this rechecked at your next visit.

## 2014-11-14 ENCOUNTER — Ambulatory Visit: Payer: Federal, State, Local not specified - PPO | Admitting: Pulmonary Disease

## 2015-01-30 ENCOUNTER — Ambulatory Visit (INDEPENDENT_AMBULATORY_CARE_PROVIDER_SITE_OTHER): Payer: Federal, State, Local not specified - PPO | Admitting: Family Medicine

## 2015-01-30 VITALS — BP 138/82 | HR 67 | Temp 98.4°F | Resp 16 | Ht 74.0 in | Wt 329.2 lb

## 2015-01-30 DIAGNOSIS — J020 Streptococcal pharyngitis: Secondary | ICD-10-CM | POA: Diagnosis not present

## 2015-01-30 DIAGNOSIS — J029 Acute pharyngitis, unspecified: Secondary | ICD-10-CM

## 2015-01-30 DIAGNOSIS — J069 Acute upper respiratory infection, unspecified: Secondary | ICD-10-CM | POA: Diagnosis not present

## 2015-01-30 LAB — POCT RAPID STREP A (OFFICE): Rapid Strep A Screen: NEGATIVE

## 2015-01-30 MED ORDER — HYDROCODONE-HOMATROPINE 5-1.5 MG/5ML PO SYRP: 5.0000 mL | ORAL_SOLUTION | Freq: Every evening | ORAL | Status: DC | PRN

## 2015-01-30 NOTE — Progress Notes (Signed)
Subjective:    Patient ID: George Hays, male    DOB: 09-11-1965, 49 y.o.   MRN: [id number]  HPI    Review of Systems     Objective:   Physical Exam        Assessment & Plan:   [facility] [address] [place] [address] [phone]  Date:  01/30/2015   Name:  George Hays   DOB:  [birth year]   MRN:  [id number]  PCP:  Hayden Rasmussen., MD    Chief Complaint: Sore Throat   History of Present Illness:  George Hays is a 49 y.o. very pleasant male patient who presents with the following:  Sore throat started couple days ago Got significantly worse last night Unsure if fever at home Dry cough started yesterday afternoon No night sweats, no weight loss Mild nasal congestion with drainage in back of throat Denies ear pain No visual change Denies CP,SOB, N/V/D Unable to sleep last night 2/2 continued cough and throat irritation   Patient Active Problem List   Diagnosis Date Noted  . Obesity 05/08/2014  . DOE (dyspnea on exertion) 03/10/2013  . OSA (obstructive sleep apnea) 03/10/2013    Past Medical History  Diagnosis Date  . GERD (gastroesophageal reflux disease)     chronic indigestion    Past Surgical History  Procedure Laterality Date  . Tonsillectomy    . Adenoidectomy    . 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 Smoking status: Former Smoker -- 4.00 packs/day for 15 years    Types: Cigarettes, Cigars    Quit date: 06/22/2002  . Smokeless tobacco: Never Used     Comment: Currently smokes 2-3 cigars a year. FOR 8 YEARS PT SMOKED 3-4 PPD  . Alcohol Use: Yes     Comment: social    Family History  Problem Relation Age of Onset  . Allergies Other   . Skin cancer Paternal Grandfather   . Lung cancer Paternal Grandmother   . Cancer Other     great grandparents and aunts/uncles  . Diabetes Mother     No Known  Allergies  Medication list has been reviewed and updated.  Current Outpatient Prescriptions on File Prior to Visit  Medication Sig Dispense Refill  . aspirin 81 MG tablet Take 81 mg by mouth daily.    George Hays esomeprazole (NEXIUM) 40 MG capsule Take 40 mg by mouth daily before breakfast.     No current facility-administered medications on file prior to visit.    Review of Systems:  All other pertinent ROS negative except as seen in HPI   Physical Examination: Filed Vitals:   01/30/15 1414  BP: 138/82  Pulse: 67  Temp: 98.4 F (36.9 C)  Resp: 16   Filed Vitals:   01/30/15 1414  Height: 6\' 2"  (1.88 m)  Weight: 329 lb 3.2 oz (149.324 kg)   Body mass index is 42.25 kg/(m^2). Ideal Body Weight: Weight in (lb) to have BMI = 25: 194.3  GEN: WDWN, NAD, Non-toxic, A & O x 3 HEENT: Atraumatic, Normocephalic. Neck supple. No masses, No LAD. mildly erythematous posterior oropharynx, mildly erythematous swollen nasal turbinates, no oropharyngeal exudate, no tonsils. Ears and Nose: No external deformity.  Clear TM bil, CV: RRR, No M/G/R. No JVD. No thrill. No extra heart sounds. PULM: CTA B, no wheezes, crackles, rhonchi. No  retractions. No resp. distress. No accessory muscle use. ABD: S, NT, obese, +BS. No rebound. No HSM.  PSYCH: Normally interactive. Conversant. Not depressed or anxious appearing.  Calm demeanor.   Results for orders placed or performed in visit on 01/30/15 (from the past 24 hour(s))  POCT rapid strep A     Status: None   Collection Time: 01/30/15  3:31 PM  Result Value Ref Range   Rapid Strep A Screen Negative Negative    Assessment and Plan: Streptococcal sore throat - Plan: POCT rapid strep A  Acute upper respiratory infection - Plan: POCT rapid strep A  Rapid strep test negative and low probability of strep given low centor score Likely viral URI Throat culture pending Hycodan cough syrup as needed at night for help with sleep Counseled on supportive care  with nasal saline spray and OTC analgesia/antipyretic PRN and delsym as needed for congestion RTC if sig worsening fever, with productive cough and worsening new symptoms   Signed Suezanne Jacquet Adams-Doolittle, D

## 2015-01-30 NOTE — Patient Instructions (Signed)
Rapid strep test negative and low probability of strep given low centor score Likely viral URI supportive care with nasal saline spray and OTC analgesia/antipyretic PRN and delsym as needed for congestion RTC if sig worsening fever, with productive cough and worsening new symptoms Sending throat culture will call with result ASAP

## 2015-02-01 LAB — CULTURE, GROUP A STREP: Organism ID, Bacteria: NORMAL

## 2015-02-03 ENCOUNTER — Ambulatory Visit (INDEPENDENT_AMBULATORY_CARE_PROVIDER_SITE_OTHER): Payer: Federal, State, Local not specified - PPO | Admitting: Emergency Medicine

## 2015-02-03 VITALS — BP 140/90 | HR 72 | Temp 98.2°F | Resp 16 | Ht 74.0 in | Wt 326.6 lb

## 2015-02-03 DIAGNOSIS — J209 Acute bronchitis, unspecified: Secondary | ICD-10-CM

## 2015-02-03 DIAGNOSIS — J0140 Acute pansinusitis, unspecified: Secondary | ICD-10-CM | POA: Diagnosis not present

## 2015-02-03 MED ORDER — PSEUDOEPHEDRINE-GUAIFENESIN ER 60-600 MG PO TB12: 1.0000 | ORAL_TABLET | Freq: Two times a day (BID) | ORAL | Status: AC

## 2015-02-03 MED ORDER — AMOXICILLIN-POT CLAVULANATE 875-125 MG PO TABS: 1.0000 | ORAL_TABLET | Freq: Two times a day (BID) | ORAL | Status: DC

## 2015-02-03 MED ORDER — HYDROCOD POLST-CPM POLST ER 10-8 MG/5ML PO SUER: 5.0000 mL | Freq: Two times a day (BID) | ORAL | Status: DC

## 2015-02-03 NOTE — Progress Notes (Signed)
Subjective:  Patient ID: George Hays, male    DOB: [birth year]  Age: 49 y.o. MRN: [id number]  CC: Follow-up and Headache   HPI George Hays presents  for recheck. He was here on 8/10 and treated for a viral upper respiratory infection. Since that time he is worsened. He has a cough productive purulent sputum is purulent postnasal drainage and he has clear nasal discharge. He denies any fever chills. No wheezing or shortness of breath. No nausea or vomiting. No sore throat or ear pain. He's had no improvement with the medication was on previously.  History George Hays has a past medical history of GERD (gastroesophageal reflux disease).   He has past surgical history that includes Tonsillectomy; Adenoidectomy; and Finger amputation.   His  family history includes Allergies in his other; Cancer in his other; Diabetes in his mother; Lung cancer in his paternal grandmother; Skin cancer in his paternal grandfather.  He   reports that he quit smoking about 12 years ago. His smoking use included Cigarettes and Cigars. He has a 60 pack-year smoking history. He has never used smokeless tobacco. He reports that he drinks alcohol. He reports that he does not use illicit drugs.  Outpatient Prescriptions Prior to Visit  Medication Sig Dispense Refill  . aspirin 81 MG tablet Take 81 mg by mouth daily.    Marland Kitchen esomeprazole (NEXIUM) 40 MG capsule Take 40 mg by mouth daily before breakfast.    . HYDROcodone-homatropine (HYCODAN) 5-1.5 MG/5ML syrup Take 5 mLs by mouth at bedtime as needed for cough (and inability to sleep). 60 mL 0   No facility-administered medications prior to visit.    Social History   Social History  . Marital Status: Single    Spouse Name: N/A  . Number of Children: N/A  . Years of Education: N/A   Occupational History  . Company secretary    Social History Main Topics  . Smoking status: Former Smoker -- 4.00 packs/day for 15 years    Types: Cigarettes, Cigars   Quit date: 06/22/2002  . Smokeless tobacco: Never Used     Comment: Currently smokes 2-3 cigars a year. FOR 8 YEARS PT SMOKED 3-4 PPD  . Alcohol Use: Yes     Comment: social  . Drug Use: No  . Sexual Activity: Not Asked   Other Topics Concern  . None   Social History Narrative     Review of Systems  Constitutional: Negative for fever, chills and appetite change.  HENT: Negative for congestion, ear pain, postnasal drip, sinus pressure and sore throat.   Eyes: Negative for pain and redness.  Respiratory: Negative for cough, shortness of breath and wheezing.   Cardiovascular: Negative for leg swelling.  Gastrointestinal: Negative for nausea, vomiting, abdominal pain, diarrhea, constipation and blood in stool.  Endocrine: Negative for polyuria.  Genitourinary: Negative for dysuria, urgency, frequency and flank pain.  Musculoskeletal: Negative for gait problem.  Skin: Negative for rash.  Neurological: Negative for weakness and headaches.  Psychiatric/Behavioral: Negative for confusion and decreased concentration. The patient is not nervous/anxious.     Objective:  BP 140/90 mmHg  Pulse 72  Temp(Src) 98.2 F (36.8 C) (Oral)  Resp 16  Ht 6\' 2"  (1.88 m)  Wt 326 lb 9.6 oz (148.145 kg)  BMI 41.92 kg/m2  SpO2 98%  Physical Exam  Constitutional: He is oriented to person, place, and time. He appears well-developed and well-nourished.  HENT:  Head: Normocephalic and atraumatic.  Eyes: Conjunctivae are  normal. Pupils are equal, round, and reactive to light.  Pulmonary/Chest: Effort normal.  Musculoskeletal: He exhibits no edema.  Neurological: He is alert and oriented to person, place, and time.  Skin: Skin is dry. Lesion noted.  Psychiatric: He has a normal mood and affect. His behavior is normal. Thought content normal.      Assessment & Plan:   George Hays was seen today for follow-up and headache.  Diagnoses and all orders for this visit:  Acute pansinusitis, recurrence not  specified  Acute bronchitis, unspecified organism  Other orders -     pseudoephedrine-guaifenesin (MUCINEX D) 60-600 MG per tablet; Take 1 tablet by mouth every 12 (twelve) hours. -     amoxicillin-clavulanate (AUGMENTIN) 875-125 MG per tablet; Take 1 tablet by mouth 2 (two) times daily. -     chlorpheniramine-HYDROcodone (TUSSIONEX PENNKINETIC ER) 10-8 MG/5ML SUER; Take 5 mLs by mouth 2 (two) times daily.   I am having George Hays start on pseudoephedrine-guaifenesin, amoxicillin-clavulanate, and chlorpheniramine-HYDROcodone. I am also having him maintain his esomeprazole, aspirin, and HYDROcodone-homatropine.  Meds ordered this encounter  Medications  . pseudoephedrine-guaifenesin (MUCINEX D) 60-600 MG per tablet    Sig: Take 1 tablet by mouth every 12 (twelve) hours.    Dispense:  18 tablet    Refill:  0  . amoxicillin-clavulanate (AUGMENTIN) 875-125 MG per tablet    Sig: Take 1 tablet by mouth 2 (two) times daily.    Dispense:  20 tablet    Refill:  0  . chlorpheniramine-HYDROcodone (TUSSIONEX PENNKINETIC ER) 10-8 MG/5ML SUER    Sig: Take 5 mLs by mouth 2 (two) times daily.    Dispense:  60 mL    Refill:  0    Appropriate red flag conditions were discussed with the patient as well as actions that should be taken.  Patient expressed his understanding.  Follow-up: Return if symptoms worsen or fail to improve.  Roselee Culver, MD

## 2015-02-03 NOTE — Patient Instructions (Signed)

## 2016-05-22 ENCOUNTER — Ambulatory Visit (INDEPENDENT_AMBULATORY_CARE_PROVIDER_SITE_OTHER): Payer: Federal, State, Local not specified - PPO | Admitting: Orthopaedic Surgery

## 2016-06-05 ENCOUNTER — Ambulatory Visit (INDEPENDENT_AMBULATORY_CARE_PROVIDER_SITE_OTHER): Payer: Federal, State, Local not specified - PPO | Admitting: Orthopaedic Surgery

## 2016-06-05 ENCOUNTER — Ambulatory Visit (INDEPENDENT_AMBULATORY_CARE_PROVIDER_SITE_OTHER): Payer: Self-pay

## 2016-06-05 ENCOUNTER — Encounter (INDEPENDENT_AMBULATORY_CARE_PROVIDER_SITE_OTHER): Payer: Self-pay | Admitting: Orthopaedic Surgery

## 2016-06-05 VITALS — BP 134/89 | HR 75 | Ht 75.0 in | Wt 320.0 lb

## 2016-06-05 DIAGNOSIS — M25552 Pain in left hip: Secondary | ICD-10-CM | POA: Diagnosis not present

## 2016-06-05 MED ORDER — METHOCARBAMOL 500 MG PO TABS: 500.0000 mg | ORAL_TABLET | Freq: Four times a day (QID) | ORAL | 0 refills | Status: DC

## 2016-06-05 NOTE — Patient Instructions (Signed)
Back Exercises Introduction If you have pain in your back, do these exercises 2-3 times each day or as told by your doctor. When the pain goes away, do the exercises once each day, but repeat the steps more times for each exercise (do more repetitions). If you do not have pain in your back, do these exercises once each day or as told by your doctor. Exercises Single Knee to Chest  Do these steps 3-5 times in a row for each leg: 1. Lie on your back on a firm bed or the floor with your legs stretched out. 2. Bring one knee to your chest. 3. Hold your knee to your chest by grabbing your knee or thigh. 4. Pull on your knee until you feel a gentle stretch in your lower back. 5. Keep doing the stretch for 10-30 seconds. 6. Slowly let go of your leg and straighten it. Pelvic Tilt  Do these steps 5-10 times in a row: 1. Lie on your back on a firm bed or the floor with your legs stretched out. 2. Bend your knees so they point up to the ceiling. Your feet should be flat on the floor. 3. Tighten your lower belly (abdomen) muscles to press your lower back against the floor. This will make your tailbone point up to the ceiling instead of pointing down to your feet or the floor. 4. Stay in this position for 5-10 seconds while you gently tighten your muscles and breathe evenly. Cat-Cow  Do these steps until your lower back bends more easily: 1. Get on your hands and knees on a firm surface. Keep your hands under your shoulders, and keep your knees under your hips. You may put padding under your knees. 2. Let your head hang down, and make your tailbone point down to the floor so your lower back is round like the back of a cat. 3. Stay in this position for 5 seconds. 4. Slowly lift your head and make your tailbone point up to the ceiling so your back hangs low (sags) like the back of a cow. 5. Stay in this position for 5 seconds. Press-Ups  Do these steps 5-10 times in a row: 1. Lie on your belly  (face-down) on the floor. 2. Place your hands near your head, about shoulder-width apart. 3. While you keep your back relaxed and keep your hips on the floor, slowly straighten your arms to raise the top half of your body and lift your shoulders. Do not use your back muscles. To make yourself more comfortable, you may change where you place your hands. 4. Stay in this position for 5 seconds. 5. Slowly return to lying flat on the floor. Bridges  Do these steps 10 times in a row: 1. Lie on your back on a firm surface. 2. Bend your knees so they point up to the ceiling. Your feet should be flat on the floor. 3. Tighten your butt muscles and lift your butt off of the floor until your waist is almost as high as your knees. If you do not feel the muscles working in your butt and the back of your thighs, slide your feet 1-2 inches farther away from your butt. 4. Stay in this position for 3-5 seconds. 5. Slowly lower your butt to the floor, and let your butt muscles relax. If this exercise is too easy, try doing it with your arms crossed over your chest. Belly Crunches  Do these steps 5-10 times in a row: 1. Lie   on your back on a firm bed or the floor with your legs stretched out. 2. Bend your knees so they point up to the ceiling. Your feet should be flat on the floor. 3. Cross your arms over your chest. 4. Tip your chin a little bit toward your chest but do not bend your neck. 5. Tighten your belly muscles and slowly raise your chest just enough to lift your shoulder blades a tiny bit off of the floor. 6. Slowly lower your chest and your head to the floor. Back Lifts  Do these steps 5-10 times in a row: 1. Lie on your belly (face-down) with your arms at your sides, and rest your forehead on the floor. 2. Tighten the muscles in your legs and your butt. 3. Slowly lift your chest off of the floor while you keep your hips on the floor. Keep the back of your head in line with the curve in your back.  Look at the floor while you do this. 4. Stay in this position for 3-5 seconds. 5. Slowly lower your chest and your face to the floor. Contact a doctor if:  Your back pain gets a lot worse when you do an exercise.  Your back pain does not lessen 2 hours after you exercise. If you have any of these problems, stop doing the exercises. Do not do them again unless your doctor says it is okay. Get help right away if:  You have sudden, very bad back pain. If this happens, stop doing the exercises. Do not do them again unless your doctor says it is okay. This information is not intended to replace advice given to you by your health care provider. Make sure you discuss any questions you have with your health care provider. Document Released: 07/11/2010 Document Revised: 11/14/2015 Document Reviewed: 08/02/2014  2017 Elsevier  

## 2016-06-05 NOTE — Progress Notes (Signed)
   Office Visit Note   Patient: George Hays           Date of Birth: [birth year]           MRN: [id number] Visit Date: 06/05/2016              Requested by: Hayden Rasmussen, MD [place], [place] [address] PCP: Hayden Rasmussen., MD   Assessment & Plan: Visit Diagnoses: Low back pain with referred discomfort into her left hip.  Plan: Robaxin as muscle relaxant, exercises for low back pain. If no improvement within 3-4 weeks would suggest an MRI scan Follow-Up Instructions: No Follow-up on file.   Orders:  No orders of the defined types were placed in this encounter.  No orders of the defined types were placed in this encounter.     Procedures: No procedures performed   Clinical Data: No additional findings.   Subjective: No chief complaint on file.   Left hip pain radiates to buttock, no hamstring pain.   Pain has gotten gradually worse over the last 6 months.  Mr. Dona experienced insidious onset of low back pain associated with superior anterior left groin pain over period of months. Pain seems to have been exacerbated with his work where he is lifting twisting and turning on a continuous basis. No numbness or tingling. No referred pain beyond the knee. He denies any history of injury or trauma or ecchymosis or erythema. Denies bowel or bladder dysfunction.  Review of Systems   Objective: Vital Signs: There were no vitals taken for this visit.  Physical Exam  Ortho Exam  Specialty Comments:  No specialty comments available.  Imaging: No results found.   PMFS History: Patient Active Problem List   Diagnosis Date Noted  . Obesity 05/08/2014  . DOE (dyspnea on exertion) 03/10/2013  . OSA (obstructive sleep apnea) 03/10/2013   Past Medical History:  Diagnosis Date  . GERD (gastroesophageal reflux disease)    chronic indigestion    Family History  Problem Relation Age of Onset  . Allergies Other   . Skin cancer Paternal Grandfather    . Lung cancer Paternal Grandmother   . Cancer Other     great grandparents and aunts/uncles  . Diabetes Mother     Past Surgical History:  Procedure Laterality Date  . ADENOIDECTOMY    . 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 TONSILLECTOMY     Social History   Occupational History  . Company secretary [employer]   Social History Main Topics  . Smoking status: Former Smoker    Packs/day: 4.00    Years: 15.00    Types: Cigarettes, Cigars    Quit date: 06/22/2002  . Smokeless tobacco: Never Used     Comment: Currently smokes 2-3 cigars a year. FOR 8 YEARS PT SMOKED 3-4 PPD  . Alcohol use Yes     Comment: social  . Drug use: No  . Sexual activity: Not on file

## 2017-11-12 ENCOUNTER — Other Ambulatory Visit (INDEPENDENT_AMBULATORY_CARE_PROVIDER_SITE_OTHER): Payer: Self-pay | Admitting: Radiology

## 2017-11-12 ENCOUNTER — Encounter (INDEPENDENT_AMBULATORY_CARE_PROVIDER_SITE_OTHER): Payer: Self-pay | Admitting: Orthopaedic Surgery

## 2017-11-12 ENCOUNTER — Ambulatory Visit (INDEPENDENT_AMBULATORY_CARE_PROVIDER_SITE_OTHER): Payer: Self-pay

## 2017-11-12 ENCOUNTER — Ambulatory Visit (INDEPENDENT_AMBULATORY_CARE_PROVIDER_SITE_OTHER): Payer: Federal, State, Local not specified - PPO | Admitting: Orthopaedic Surgery

## 2017-11-12 VITALS — BP 111/73 | HR 78 | Ht 74.0 in | Wt 320.0 lb

## 2017-11-12 DIAGNOSIS — M545 Low back pain, unspecified: Secondary | ICD-10-CM

## 2017-11-12 DIAGNOSIS — G8929 Other chronic pain: Secondary | ICD-10-CM | POA: Diagnosis not present

## 2017-11-12 DIAGNOSIS — M25552 Pain in left hip: Secondary | ICD-10-CM | POA: Diagnosis not present

## 2017-11-12 NOTE — Progress Notes (Signed)
Office Visit Note   Patient: George Hays           Date of Birth: 1965-09-09           MRN: [id number] Visit Date: 11/12/2017              Requested by: Hayden Rasmussen, MD [address] [place], [place] [address] PCP: Hayden Rasmussen, MD   Assessment & Plan: Visit Diagnoses:  1. Pain in left hip   2. 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  X-rays revealed some progression of arthritic changes from prior films.  We will obtain an MRI scan of the left hemipelvis and specifically the left hip.  Return after study.  We can consider left hip injection as a diagnostic and therapeutic procedure based on the MRI scan  Follow-Up Instructions: No follow-ups on file.   Orders:  Orders Placed This Encounter  Procedures  . XR HIP UNILAT W OR W/O PELVIS 2-3 VIEWS LEFT   No orders of the defined types were placed in this encounter.     Procedures: No procedures performed   Clinical Data: No additional findings.   Subjective: Chief Complaint  Patient presents with  . Left Hip - Pain  . New Patient (Initial Visit)    L HIP PAIN FOR 1 1/2 MO, TROUBLE WALKING, LITTLE POPPING. NO INJURY   52 year old gentleman returns to the office for evaluation of persistent low back pain associated with recent onset of left hip pain.  Pain seems to be worse the longer he stands of the further he walks.  He has had some left groin discomfort but mostly pain along the iliac crest.  No referred pain distally.  No numbness or tingling.  No bowel or bladder dysfunction.  Prior films in 2017 were negative for any obvious pathology about the left hip.  He did have degenerative disc disease at L5-S1.  HPI  Review of Systems  Constitutional: Negative for fatigue and fever.  HENT: Negative for  ear pain.   Eyes: Negative for pain.  Respiratory: Negative for cough and shortness of breath.   Cardiovascular: Negative for leg swelling.  Gastrointestinal: Negative for constipation and diarrhea.  Genitourinary: Negative for difficulty urinating.  Musculoskeletal: Positive for back pain. Negative for neck pain.  Skin: Negative for rash.  Allergic/Immunologic: Negative for food allergies.  Neurological: Positive for weakness. Negative for numbness.  Hematological: Does not bruise/bleed easily.  Psychiatric/Behavioral: Positive for sleep disturbance.     Objective: Vital Signs: BP 111/73 (BP Location: Left Arm, Patient Position: Sitting, Cuff Size: Normal)   Pulse 78   Ht 6\' 2"  (1.88 m)   Wt (!) 320 lb (145.2 kg)   BMI 41.09 kg/m   Physical Exam  Constitutional: He is oriented to person, place, and time. He appears well-developed and well-nourished.  HENT:  Mouth/Throat: Oropharynx is clear and moist.  Eyes: Pupils are equal, round, and reactive to light. EOM are normal.  Pulmonary/Chest: Effort normal.  Neurological: He is alert and oriented to person, place, and time.  Skin: Skin is warm and dry.  Psychiatric: He has a normal mood and affect. His behavior is normal.    Ortho Exam awake alert and oriented x3.  Comfortable sitting.  Straight leg raise negative  bilaterally.  No distal edema.  No knee pain bilaterally.  Mild lateral left hip pain with internal/external rotation but no groin or anterior thigh discomfort.  No loss of motion with either hip.  No swelling of either leg or ankle.  Neurovascular exam intact.  No percussible tenderness of the lumbar spine  Specialty Comments:  No specialty comments available.  Imaging: No results found.   PMFS History: Patient Active Problem List   Diagnosis Date Noted  . Obesity 05/08/2014  . DOE (dyspnea on exertion) 03/10/2013  . OSA (obstructive sleep apnea) 03/10/2013   Past Medical History:  Diagnosis Date  . GERD  (gastroesophageal reflux disease)    chronic indigestion    Family History  Problem Relation Age of Onset  . Diabetes Mother   . Lung cancer Paternal Grandmother   . Skin cancer Paternal Grandfather   . Allergies Other   . Cancer Other        great grandparents and aunts/uncles    Past Surgical History:  Procedure Laterality Date  . ADENOIDECTOMY    . 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 TONSILLECTOMY     Social History   Occupational History  . Occupation: Engineer, production: [employer]  Tobacco Use  . Smoking status: Former Smoker    Packs/day: 4.00    Years: 15.00    Pack years: 60.00    Types: Cigarettes, Cigars    Last attempt to quit: 06/22/2002    Years since quitting: 15.4  . Smokeless tobacco: Never Used  . Tobacco comment: Currently smokes 2-3 cigars a year. FOR 8 YEARS PT SMOKED 3-4 PPD  Substance and Sexual Activity  . Alcohol use: Yes    Comment: social  . Drug use: No  . Sexual activity: Not on file

## 2017-11-13 ENCOUNTER — Encounter: Payer: Self-pay | Admitting: Physician Assistant

## 2017-11-13 ENCOUNTER — Other Ambulatory Visit: Payer: Self-pay

## 2017-11-13 ENCOUNTER — Ambulatory Visit: Payer: Federal, State, Local not specified - PPO | Admitting: Physician Assistant

## 2017-11-13 VITALS — BP 118/70 | HR 73 | Temp 98.2°F | Resp 16 | Ht 72.0 in | Wt 315.4 lb

## 2017-11-13 DIAGNOSIS — L03011 Cellulitis of right finger: Secondary | ICD-10-CM

## 2017-11-13 MED ORDER — DOXYCYCLINE HYCLATE 100 MG PO TABS: 100.0000 mg | ORAL_TABLET | Freq: Two times a day (BID) | ORAL | 0 refills | Status: DC

## 2017-11-13 NOTE — Progress Notes (Signed)
   George Hays  MRN: [id number] DOB: 1966/05/19  PCP: Hayden Rasmussen, MD  Subjective:  Pt is a 52 year old male who presents to clinic for finger problem. He got a splinter in his right index finger 2 days ago. This morning pain increased. Endorses pain of middle joint, swelling and redness. He noticed yellow drainage from the area yesterday. No drainage today. The splinter is no longer present.   Denies drainage, n/t, fever, chills, reduced range of motion.   UTD tetanus vaccination.   Review of Systems  Constitutional: Negative for chills and fever.  Musculoskeletal: Positive for arthralgias (index finger, right) and joint swelling.  Skin: Positive for wound.    Patient Active Problem List   Diagnosis Date Noted  . Obesity 05/08/2014  . DOE (dyspnea on exertion) 03/10/2013  . OSA (obstructive sleep apnea) 03/10/2013    Current Outpatient Medications on File Prior to Visit  Medication Sig Dispense Refill  . aspirin 81 MG tablet Take 81 mg by mouth daily.    Marland Kitchen buPROPion (WELLBUTRIN XL) 300 MG 24 hr tablet     . esomeprazole (NEXIUM) 40 MG capsule Take by mouth.    Marland Kitchen glucosamine-chondroitin 500-400 MG tablet Take 1 tablet by mouth 3 (three) times daily.    Marland Kitchen lisinopril-hydrochlorothiazide (PRINZIDE,ZESTORETIC) 10-12.5 MG tablet     . naproxen sodium (ALEVE) 220 MG tablet Take 220 mg by mouth.    . methocarbamol (ROBAXIN) 500 MG tablet Take 1 tablet (500 mg total) by mouth 4 (four) times daily. (Patient not taking: Reported on 11/13/2017) 30 tablet 0   No current facility-administered medications on file prior to visit.     No Known Allergies   Objective:  BP 118/70 (BP Location: Left Arm, Patient Position: Sitting, Cuff Size: Large)   Pulse 73   Temp 98.2 F (36.8 C) (Oral)   Resp 16   Ht 6' (1.829 m)   Wt (!) 315 lb 6.4 oz (143.1 kg)   SpO2 97%   BMI 42.78 kg/m   Physical Exam  Constitutional: He is oriented to person, place, and time. He appears well-developed  and well-nourished.  Musculoskeletal:       Hands: Neurological: He is alert and oriented to person, place, and time.  Skin: Skin is warm and dry.  Psychiatric: He has a normal mood and affect. His behavior is normal. Judgment and thought content normal.  Vitals reviewed.   Assessment and Plan :  1. Cellulitis of finger of right hand - doxycycline (VIBRA-TABS) 100 MG tablet; Take 1 tablet (100 mg total) by mouth 2 (two) times daily.  Dispense: 20 tablet; Refill: 0 - Pt presents with worsening pain and swelling of right index finger s/p splinter. UTD tetanus. Not concerned today for osteomyelitis. Plan to cover with Doxycycline. Advised pt RTC in 48 hours if no improvement or worsening symptoms.   Mercer Pod, PA-C  [facility] 11/13/2017 8:18 AM

## 2017-11-13 NOTE — Patient Instructions (Addendum)
Start taking Doxycycline 100 mg twice daily for 10 days. If you are not improving in 2-3 days, please come back. Take the ENTIRE COURSE of antibiotics, even if you start to feel better sooner.  Take with at least 8 ounces of water and sit up for at least 30 minutes after taking to reduce the risk of esophageal irritation and ulceration. If stomach irritation occurs, take with food.   Wash your finger with non-fragrance soap and water. Keep covered until drainage stops.     Doxycycline oral tablets (Periodontitis) What is this medicine? DOXYCYCLINE (dox i SYE kleen) is a tetracycline antibiotic. It kills certain bacteria or stops their growth. This medicine is used to treat a dental infection called periodontitis. This medicine may be used for other purposes; ask your health care provider or pharmacist if you have questions. COMMON BRAND NAME(S): Oraxyl, Periostat What should I tell my health care provider before I take this medicine? They need to know if you have any of these conditions: -liver disease -long exposure to sunlight like working outdoors -stomach problems like colitis -an unusual or allergic reaction to doxycycline, tetracycline antibiotics, other medicines, foods, dyes, or preservatives -pregnant or trying to get pregnant -breast-feeding How should I use this medicine? Take this medicine by mouth with a full glass of water. Follow the directions on the prescription label. Take this medicine at least 1 hour before or 2 hours after food. Take your medicine at regular intervals. Do not take your medicine more often than directed. Take all of your medicine as directed even if you think you are better. Do not skip doses or stop your medicine early. Talk to your pediatrician regarding the use of this medicine in children. Special care may be needed. Overdosage: If you think you have taken too much of this medicine contact a poison control center or emergency room at once. NOTE:  This medicine is only for you. Do not share this medicine with others. What if I miss a dose? If you miss a dose, take it as soon as you can. If it is almost time for your next dose, take only that dose. Do not take double or extra doses. What may interact with this medicine? -antacids -barbiturates -birth control pills -bismuth subsalicylate -carbamazepine -methoxyflurane -other antibiotics -phenytoin -vitamins that contain iron -warfarin This list may not describe all possible interactions. Give your health care provider a list of all the medicines, herbs, non-prescription drugs, or dietary supplements you use. Also tell them if you smoke, drink alcohol, or use illegal drugs. Some items may interact with your medicine. What should I watch for while using this medicine? Tell your doctor or health care professional if your symptoms do not improve. Do not treat diarrhea with over the counter products. Contact your doctor if you have diarrhea that lasts more than 2 days or if it is severe and watery. Do not take this medicine just before going to bed. It may not dissolve properly when you lay down and can cause pain in your throat. Drink plenty of fluids while taking this medicine to also help reduce irritation in your throat. This medicine can make you more sensitive to the sun. Keep out of the sun. If you cannot avoid being in the sun, wear protective clothing and use sunscreen. Do not use sun lamps or tanning beds/booths. Birth control pills may not work properly while you are taking this medicine. Talk to your doctor about using an extra method of birth control.  If you are being treated for a sexually transmitted infection, avoid sexual contact until you have finished your treatment. Your sexual partner may also need treatment. Avoid antacids, aluminum, calcium, magnesium, and iron products for 4 hours before and 2 hours after taking a dose of this medicine. What side effects may I notice  from receiving this medicine? Side effects that you should report to your doctor or health care professional as soon as possible: -allergic reactions like skin rash, itching or hives, swelling of the face, lips, or tongue -difficulty breathing -fever -itching in the rectal or genital area -pain on swallowing -redness, blistering, peeling or loosening of the skin, including inside the mouth -severe stomach pain or cramps -unusual bleeding or bruising -unusually weak or tired -yellowing of the eyes or skin Side effects that usually do not require medical attention (report to your doctor or health care professional if they continue or are bothersome): -diarrhea -loss of appetite -nausea, vomiting This list may not describe all possible side effects. Call your doctor for medical advice about side effects. You may report side effects to FDA at 1-800-FDA-1088. Where should I keep my medicine? Keep out of the reach of children. Store at room temperature between 15 and 30 degrees C (59 and 86 degrees F). Protect from light. Keep container tightly closed. Throw away any unused medicine after the expiration date. Taking this medicine after the expiration date can make you seriously ill. NOTE: This sheet is a summary. It may not cover all possible information. If you have questions about this medicine, talk to your doctor, pharmacist, or health care provider.  2018 Elsevier/Gold Standard (2007-10-06 14:50:34)  IF you received an x-ray today, you will receive an invoice from [facility]. Please contact [facility] at [phone] with questions or concerns regarding your invoice.   IF you received labwork today, you will receive an invoice from [facility]. Please contact [facility] at [phone] with questions or concerns regarding your invoice.   Our billing staff will not be able to assist you with questions regarding bills from these companies.  You will be contacted with the lab  results as soon as they are available. The fastest way to get your results is to activate your My Chart account. Instructions are located on the last page of this paperwork. If you have not heard from [place] regarding the results in 2 weeks, please contact this office.

## 2017-12-28 ENCOUNTER — Ambulatory Visit
Admission: RE | Admit: 2017-12-28 | Discharge: 2017-12-28 | Disposition: A | Discharge status: Home / Self Care | Payer: Federal, State, Local not specified - PPO | Source: Ambulatory Visit | Attending: Orthopaedic Surgery | Admitting: Orthopaedic Surgery

## 2017-12-28 DIAGNOSIS — M25552 Pain in left hip: Secondary | ICD-10-CM

## 2017-12-31 ENCOUNTER — Encounter (INDEPENDENT_AMBULATORY_CARE_PROVIDER_SITE_OTHER): Payer: Self-pay | Admitting: Orthopaedic Surgery

## 2017-12-31 ENCOUNTER — Ambulatory Visit (INDEPENDENT_AMBULATORY_CARE_PROVIDER_SITE_OTHER): Payer: Federal, State, Local not specified - PPO | Admitting: Orthopaedic Surgery

## 2017-12-31 ENCOUNTER — Other Ambulatory Visit: Payer: Self-pay | Admitting: *Deleted

## 2017-12-31 VITALS — BP 132/82 | HR 71 | Ht 72.0 in | Wt 315.0 lb

## 2017-12-31 DIAGNOSIS — M25552 Pain in left hip: Secondary | ICD-10-CM

## 2017-12-31 DIAGNOSIS — M160 Bilateral primary osteoarthritis of hip: Secondary | ICD-10-CM | POA: Diagnosis not present

## 2017-12-31 NOTE — Progress Notes (Signed)
Office Visit Note   Patient: George Hays           Date of Birth: [birth year]           MRN: [id number] Visit Date: 12/31/2017              Requested by: Hayden Rasmussen, MD [address] [place], [place] [address] PCP: Hayden Rasmussen, MD   Assessment & Plan: Visit Diagnoses:  1. Bilateral primary osteoarthritis of hip     Plan: MRI scan demonstrates moderate osteoarthritis of the left hip and mild osteoarthritis of the right hip.  [name] is symptomatic on the left.  Long discussion regarding MRI scan findings and treatment options.  He presently is on meloxicam per his primary care physician.  It does not seem to be helping so he might want to try either Advil or relief.  I will also schedule an intra-articular cortisone injection of his left hip.  Discussed definitive treatment of hip replacement.  I do not think he is ready for that as yet we will plan to see him back in the next 4 to 6 weeks if no improvement.  Had a long discussion with him as well regarding his weight with a BMI of over 42.  He has lost some weight needs to continue working on that.  Follow-Up Instructions: Return if symptoms worsen or fail to improve.   Orders:  No orders of the defined types were placed in this encounter.  No orders of the defined types were placed in this encounter.     Procedures: No procedures performed   Clinical Data: No additional findings.   Subjective: Chief Complaint  Patient presents with  . Follow-up    MRI REVIEW L HIP  Mr. George Hays is been followed recently for the pain in his left hip.  X-rays of both hips were nondiagnostic with little if any loss of joint space.  The recent MRI scan demonstrates moderate osteoarthritic changes the left and mild changes on the right.  Think this certainly explains the pain he has been experiencing.  He is not having any back pain.  HPI  Review of Systems  Constitutional: Negative for fatigue and fever.  HENT:  Negative for ear pain.   Eyes: Negative for pain.  Respiratory: Negative for cough and shortness of breath.   Cardiovascular: Negative for leg swelling.  Gastrointestinal: Negative for constipation and diarrhea.  Genitourinary: Negative for difficulty urinating.  Musculoskeletal: Positive for back pain. Negative for neck pain.  Skin: Negative for rash.  Allergic/Immunologic: Negative for food allergies.  Neurological: Positive for weakness. Negative for numbness.  Hematological: Does not bruise/bleed easily.  Psychiatric/Behavioral: Positive for sleep disturbance.     Objective: Vital Signs: BP 132/82 (BP Location: Left Arm, Patient Position: Sitting, Cuff Size: Normal)   Pulse 71   Ht 6' (1.829 m)   Wt (!) 315 lb (142.9 kg)   BMI 42.72 kg/m   Physical Exam  Constitutional: He is oriented to person, place, and time. He appears well-developed and well-nourished.  HENT:  Mouth/Throat: Oropharynx is clear and moist.  Eyes: Pupils are equal, round, and reactive to light. EOM are normal.  Pulmonary/Chest: Effort normal.  Neurological: He is alert and oriented to person, place, and time.  Skin: Skin is warm and dry.  Psychiatric: He has a normal mood and affect. His behavior is normal.    Ortho Exam awake alert and oriented x3.  Comfortable sitting.  No pain  with ambulation.  Mild pain on the extremes of internal and external rotation of his left hip.  Leg lengths symmetrical.  No thigh pain.  No distal edema.  Neurovascular exam intact lengths appear to be symmetrical.  Straight leg raise negative.  Percussible back pain.  Specialty Comments:  No specialty comments available.  Imaging: No results found.   PMFS History: Patient Active Problem List   Diagnosis Date Noted  . Obesity 05/08/2014  . DOE (dyspnea on exertion) 03/10/2013  . OSA (obstructive sleep apnea) 03/10/2013   Past Medical History:  Diagnosis Date  . Arthritis   . GERD (gastroesophageal reflux disease)      chronic indigestion    Family History  Problem Relation Age of Onset  . Diabetes Mother   . Lung cancer Paternal Grandmother   . Skin cancer Paternal Grandfather   . Allergies Other   . Cancer Other        great grandparents and aunts/uncles    Past Surgical History:  Procedure Laterality Date  . ADENOIDECTOMY    . 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 TONSILLECTOMY     Social History   Occupational History  . Occupation: Engineer, production: [employer]  Tobacco Use  . Smoking status: Former Smoker    Packs/day: 4.00    Years: 15.00    Pack years: 60.00    Types: Cigarettes, Cigars    Last attempt to quit: 06/22/2002    Years since quitting: 15.5  . Smokeless tobacco: Never Used  . Tobacco comment: Currently smokes 2-3 cigars a year. FOR 8 YEARS PT SMOKED 3-4 PPD  Substance and Sexual Activity  . Alcohol use: Yes    Comment: social  . Drug use: No  . Sexual activity: Not on file

## 2018-01-21 ENCOUNTER — Ambulatory Visit
Admission: RE | Admit: 2018-01-21 | Discharge: 2018-01-21 | Disposition: A | Discharge status: Home / Self Care | Payer: Federal, State, Local not specified - PPO | Source: Ambulatory Visit | Attending: Orthopaedic Surgery | Admitting: Orthopaedic Surgery

## 2018-01-21 DIAGNOSIS — M25552 Pain in left hip: Secondary | ICD-10-CM

## 2018-01-21 MED ORDER — METHYLPREDNISOLONE ACETATE 40 MG/ML INJ SUSP (RADIOLOG
120.0000 mg | Freq: Once | INTRAMUSCULAR | Status: AC
  Administered 2018-01-21: 120 mg via INTRA_ARTICULAR

## 2018-01-21 MED ORDER — IOPAMIDOL (ISOVUE-M 200) INJECTION 41%
1.0000 mL | Freq: Once | INTRAMUSCULAR | Status: AC
  Administered 2018-01-21: 1 mL via INTRA_ARTICULAR

## 2018-04-25 ENCOUNTER — Encounter (INDEPENDENT_AMBULATORY_CARE_PROVIDER_SITE_OTHER): Payer: Self-pay | Admitting: Orthopaedic Surgery

## 2018-04-25 ENCOUNTER — Ambulatory Visit (INDEPENDENT_AMBULATORY_CARE_PROVIDER_SITE_OTHER): Payer: Federal, State, Local not specified - PPO | Admitting: Orthopaedic Surgery

## 2018-04-25 ENCOUNTER — Other Ambulatory Visit (INDEPENDENT_AMBULATORY_CARE_PROVIDER_SITE_OTHER): Payer: Self-pay | Admitting: Radiology

## 2018-04-25 VITALS — BP 124/78 | HR 75 | Ht 74.0 in | Wt 307.4 lb

## 2018-04-25 DIAGNOSIS — M25552 Pain in left hip: Secondary | ICD-10-CM | POA: Diagnosis not present

## 2018-04-25 NOTE — Progress Notes (Signed)
Office Visit Note   Patient: George Hays           Date of Birth: 1966/05/12           MRN: [id number] Visit Date: 04/25/2018              Requested by: George Rasmussen, MD [address] [place], [place] [address] PCP: George Rasmussen, MD   Assessment & Plan: Visit Diagnoses:  1. Pain of left hip joint     Plan: Osteoarthritis left hip.  Long discussion regarding diagnosis and future treatment options.  I will order intra-articular cortisone injection.  George Hays is working on his weight with the present BMI of 39.5 discussed definitive treatment of hip replacement again  Follow-Up Instructions: Return if symptoms worsen or fail to improve.   Orders:  No orders of the defined types were placed in this encounter.  No orders of the defined types were placed in this encounter.     Procedures: No procedures performed   Clinical Data: No additional findings.   Subjective: Chief Complaint  Patient presents with  . 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  Having some recurrent pain.  Presently on Mobic.  Localized to the left groin and anterior thigh with prior films consistent with osteoarthritis  HPI  Review of Systems  Constitutional: Negative for fatigue and fever.  HENT: Negative for ear pain.   Eyes: Negative for pain.  Respiratory: Negative for cough and shortness of breath.   Cardiovascular: Negative for leg swelling.  Gastrointestinal: Negative for constipation and diarrhea.  Genitourinary: Negative for difficulty urinating.  Musculoskeletal: Positive for back pain. Negative for neck pain.  Skin: Negative for rash.  Allergic/Immunologic: Negative for food allergies.  Neurological: Positive for weakness. Negative for numbness.  Hematological: Does not bruise/bleed easily.  Psychiatric/Behavioral:  Positive for sleep disturbance.     Objective: Vital Signs: BP 124/78 (BP Location: Left Arm, Patient Position: Sitting, Cuff Size: Normal)   Pulse 75   Ht 6\' 2"  (1.88 m)   Wt (!) 307 lb 6.4 oz (139.4 kg)   BMI 39.47 kg/m   Physical Exam  Constitutional: He is oriented to person, place, and time. He appears well-developed and well-nourished.  HENT:  Mouth/Throat: Oropharynx is clear and moist.  Eyes: Pupils are equal, round, and reactive to light. EOM are normal.  Pulmonary/Chest: Effort normal.  Neurological: He is alert and oriented to person, place, and time.  Skin: Skin is warm and dry.  Psychiatric: He has a normal mood and affect. His behavior is normal.    Ortho Exam awake alert and oriented x3.  Comfortable sitting.  Does have a limp.  Does have pain with internal/external rotation of his left hip but about 20 degrees of internal/external rotation.  Straight leg raise negative.  No distal edema.  No calf pain or knee pain  Specialty Comments:  No specialty comments available.  Imaging: No results found.   PMFS History: Patient Active Problem List   Diagnosis Date Noted  . Obesity 05/08/2014  . DOE (dyspnea on exertion) 03/10/2013  . OSA (obstructive sleep apnea) 03/10/2013   Past Medical History:  Diagnosis Date  . Arthritis   . GERD (gastroesophageal reflux disease)    chronic indigestion    Family History  Problem Relation Age of Onset  .  Diabetes Mother   . Lung cancer Paternal Grandmother   . Skin cancer Paternal Grandfather   . Allergies Other   . Cancer Other        great grandparents and aunts/uncles    Past Surgical History:  Procedure Laterality Date  . ADENOIDECTOMY    . 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 TONSILLECTOMY     Social History   Occupational History  . Occupation: Engineer, production: [employer]  Tobacco Use  . Smoking status: Former Smoker    Packs/day: 4.00     Years: 15.00    Pack years: 60.00    Types: Cigarettes, Cigars    Last attempt to quit: 06/22/2002    Years since quitting: 15.8  . Smokeless tobacco: Never Used  . Tobacco comment: Currently smokes 2-3 cigars a year. FOR 8 YEARS PT SMOKED 3-4 PPD  Substance and Sexual Activity  . Alcohol use: Yes    Comment: social  . Drug use: No  . Sexual activity: Not on file

## 2018-04-29 ENCOUNTER — Other Ambulatory Visit (INDEPENDENT_AMBULATORY_CARE_PROVIDER_SITE_OTHER): Payer: Self-pay | Admitting: Orthopaedic Surgery

## 2018-04-29 DIAGNOSIS — M25552 Pain in left hip: Secondary | ICD-10-CM

## 2018-05-06 ENCOUNTER — Telehealth (INDEPENDENT_AMBULATORY_CARE_PROVIDER_SITE_OTHER): Payer: Self-pay | Admitting: Orthopaedic Surgery

## 2018-05-06 NOTE — Telephone Encounter (Signed)
George Hays from [facility] called to let you know that patient is scheduled on Friday, 11/22 at 10:00 am

## 2018-05-13 ENCOUNTER — Ambulatory Visit
Admission: RE | Admit: 2018-05-13 | Discharge: 2018-05-13 | Disposition: A | Discharge status: Home / Self Care | Payer: Federal, State, Local not specified - PPO | Source: Ambulatory Visit | Attending: Orthopaedic Surgery | Admitting: Orthopaedic Surgery

## 2018-05-13 DIAGNOSIS — M25552 Pain in left hip: Secondary | ICD-10-CM

## 2018-05-13 MED ORDER — IOPAMIDOL (ISOVUE-M 200) INJECTION 41%
1.0000 mL | Freq: Once | INTRAMUSCULAR | Status: AC
  Administered 2018-05-13: 1 mL via INTRA_ARTICULAR

## 2018-05-13 MED ORDER — METHYLPREDNISOLONE ACETATE 40 MG/ML INJ SUSP (RADIOLOG
120.0000 mg | Freq: Once | INTRAMUSCULAR | Status: AC
  Administered 2018-05-13: 120 mg via INTRA_ARTICULAR

## 2019-07-06 ENCOUNTER — Ambulatory Visit: Payer: Self-pay

## 2019-07-06 ENCOUNTER — Ambulatory Visit: Payer: Federal, State, Local not specified - PPO | Admitting: Orthopaedic Surgery

## 2019-07-06 ENCOUNTER — Other Ambulatory Visit: Payer: Self-pay

## 2019-07-06 ENCOUNTER — Encounter: Payer: Self-pay | Admitting: Orthopaedic Surgery

## 2019-07-06 VITALS — Ht 74.0 in | Wt 306.0 lb

## 2019-07-06 DIAGNOSIS — M1612 Unilateral primary osteoarthritis, left hip: Secondary | ICD-10-CM

## 2019-07-06 DIAGNOSIS — G8929 Other chronic pain: Secondary | ICD-10-CM

## 2019-07-06 DIAGNOSIS — M25552 Pain in left hip: Secondary | ICD-10-CM

## 2019-07-06 DIAGNOSIS — M545 Low back pain: Secondary | ICD-10-CM | POA: Diagnosis not present

## 2019-07-06 NOTE — Progress Notes (Signed)
Office Visit Note   Patient: George Hays           Date of Birth: 1965/08/04           MRN: [id number] Visit Date: 07/06/2019              Requested by: Hayden Rasmussen, MD [address] [place],  [place] [address] PCP: Hayden Rasmussen, MD   Assessment & Plan: Visit Diagnoses:  1. Pain in left hip   2. Chronic left-sided low back pain, unspecified whether sciatica present   3. Unilateral primary osteoarthritis, left hip     Plan: Osteoarthritis left hip.  Long discussion regarding his diagnosis and treatment options.  Will schedule another intra-articular cortisone injection.  His BMI is 40.  He has lost 50 pounds but still "working on it".  He is presently in a weight loss program.  He needs to get below a BMI of 38 to consider surgery.  He might be a good candidate for an anterior approach.  We will also complete a form for handicap parking and a note for light duty work to allow him time off of his feet.  Follow-Up Instructions: Return if symptoms worsen or fail to improve.   Orders:  Orders Placed This Encounter  Procedures  . XR HIP UNILAT W OR W/O PELVIS 2-3 VIEWS LEFT  . XR Lumbar Spine 2-3 Views  . DL FLUORO GUIDED NEEDLE PLC ASPIRATION / INJECTTION/LOC  . Ambulatory referral to Physical Therapy   No orders of the defined types were placed in this encounter.     Procedures: No procedures performed   Clinical Data: No additional findings.   Subjective: Chief Complaint  Patient presents with  . Left Hip - Pain  Patient presents today for left hip pain. He said that it has been hurting for 4 years. He said that it has progressed over time. His pain is located anteriorly. He has pain that will occasionally go down his leg. No numbness or tingling in his legs. He is taking Gabapentin, Meloxicam, and Tylenol. He has injections in his left hip and had inconsistent results. He also states that he has some lower back pain that is mostly on the left side.  The pain is worse with any lifting or carrying.  Having compromise of his activities at work related to his hip pain.  Has had some mild back discomfort with lifting that does not appear to be radicular in nature HPI  Review of Systems  Constitutional: Negative for fatigue.  HENT: Negative for ear pain.   Eyes: Negative for pain.  Respiratory: Negative for shortness of breath.   Cardiovascular: Negative for leg swelling.  Gastrointestinal: Negative for constipation and diarrhea.  Endocrine: Positive for heat intolerance. Negative for cold intolerance.  Genitourinary: Negative for difficulty urinating.  Musculoskeletal: Negative for joint swelling.  Skin: Negative for rash.  Allergic/Immunologic: Negative for food allergies.  Neurological: Negative for weakness.  Psychiatric/Behavioral: Positive for sleep disturbance.     Objective: Vital Signs: Ht 6\' 2"  (1.88 m)   Wt (!) 306 lb (138.8 kg)   BMI 39.29 kg/m   Physical Exam Constitutional:      Appearance: He is well-developed.  Eyes:     Pupils: Pupils are equal, round, and reactive to light.  Pulmonary:     Effort: Pulmonary effort is normal.  Skin:    General: Skin is warm and dry.  Neurological:     Mental Status: He  is alert and oriented to person, place, and time.  Psychiatric:        Behavior: Behavior normal.     Ortho Exam considerable pain with internal and external rotation of his left hip.  Only had about 10 degrees of internal and external rotation from a neutral position.  Neurologically intact.  Straight leg raise negative.  No percussible tenderness of his lumbar spine.  Leg lengths appear to be symmetrical  Specialty Comments:  No specialty comments available.  Imaging: XR HIP UNILAT W OR W/O PELVIS 2-3 VIEWS LEFT  Result Date: 07/06/2019 AP pelvis and lateral left hip are obtained demonstrating narrowing of the superior left hip joint consistent with osteoarthritis.  There is about 20% uncoverage of  both hips.  Joint spaces maintained on the right  XR Lumbar Spine 2-3 Views  Result Date: 07/06/2019 Films of the lumbar spine were obtained in 2 projections.  There is a very slight left degenerative scoliosis.  There is narrowing of the L5-S1 disc space and facet joint arthritis at L4-5 and L5-S1.  Diffuse calcification of the abdominal aorta but without obvious aneurysmal dilatation.  No listhesis    PMFS History: Patient Active Problem List   Diagnosis Date Noted  . Unilateral primary osteoarthritis, left hip 07/06/2019  . Obesity 05/08/2014  . DOE (dyspnea on exertion) 03/10/2013  . OSA (obstructive sleep apnea) 03/10/2013   Past Medical History:  Diagnosis Date  . Arthritis   . GERD (gastroesophageal reflux disease)    chronic indigestion    Family History  Problem Relation Age of Onset  . Diabetes Mother   . Lung cancer Paternal Grandmother   . Skin cancer Paternal Grandfather   . Allergies Other   . Cancer Other        great grandparents and aunts/uncles    Past Surgical History:  Procedure Laterality Date  . ADENOIDECTOMY    . 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 TONSILLECTOMY     Social History   Occupational History  . Occupation: Engineer, production: [employer]  Tobacco Use  . Smoking status: Former Smoker    Packs/day: 4.00    Years: 15.00    Pack years: 60.00    Types: Cigarettes, Cigars    Quit date: 06/22/2002    Years since quitting: 17.0  . Smokeless tobacco: Never Used  . Tobacco comment: Currently smokes 2-3 cigars a year. FOR 8 YEARS PT SMOKED 3-4 PPD  Substance and Sexual Activity  . Alcohol use: Yes    Comment: social  . Drug use: No  . Sexual activity: Not on file

## 2019-07-07 ENCOUNTER — Other Ambulatory Visit: Payer: Self-pay | Admitting: Orthopaedic Surgery

## 2019-07-13 ENCOUNTER — Ambulatory Visit
Admission: RE | Admit: 2019-07-13 | Discharge: 2019-07-13 | Disposition: A | Discharge status: Home / Self Care | Payer: Federal, State, Local not specified - PPO | Source: Ambulatory Visit | Attending: Orthopaedic Surgery | Admitting: Orthopaedic Surgery

## 2019-07-13 DIAGNOSIS — M25552 Pain in left hip: Secondary | ICD-10-CM

## 2019-07-13 MED ORDER — METHYLPREDNISOLONE ACETATE 40 MG/ML INJ SUSP (RADIOLOG
120.0000 mg | Freq: Once | INTRAMUSCULAR | Status: AC
  Administered 2019-07-13: 120 mg via EPIDURAL

## 2019-07-13 MED ORDER — IOPAMIDOL (ISOVUE-M 200) INJECTION 41%
1.0000 mL | Freq: Once | INTRAMUSCULAR | Status: AC
  Administered 2019-07-13: 1 mL via INTRA_ARTICULAR

## 2019-07-20 ENCOUNTER — Ambulatory Visit: Payer: Federal, State, Local not specified - PPO | Admitting: Physical Therapy

## 2019-08-15 IMAGING — XA DG FLUORO GUIDE NDL PLC/BX
1 series · 1 of 1 positions shown · non-contrast
Comparison: none

CLINICAL DATA: Left hip pain

[Series 1: ortho standard · 1 of 1 slices shown]
[im 1/1]
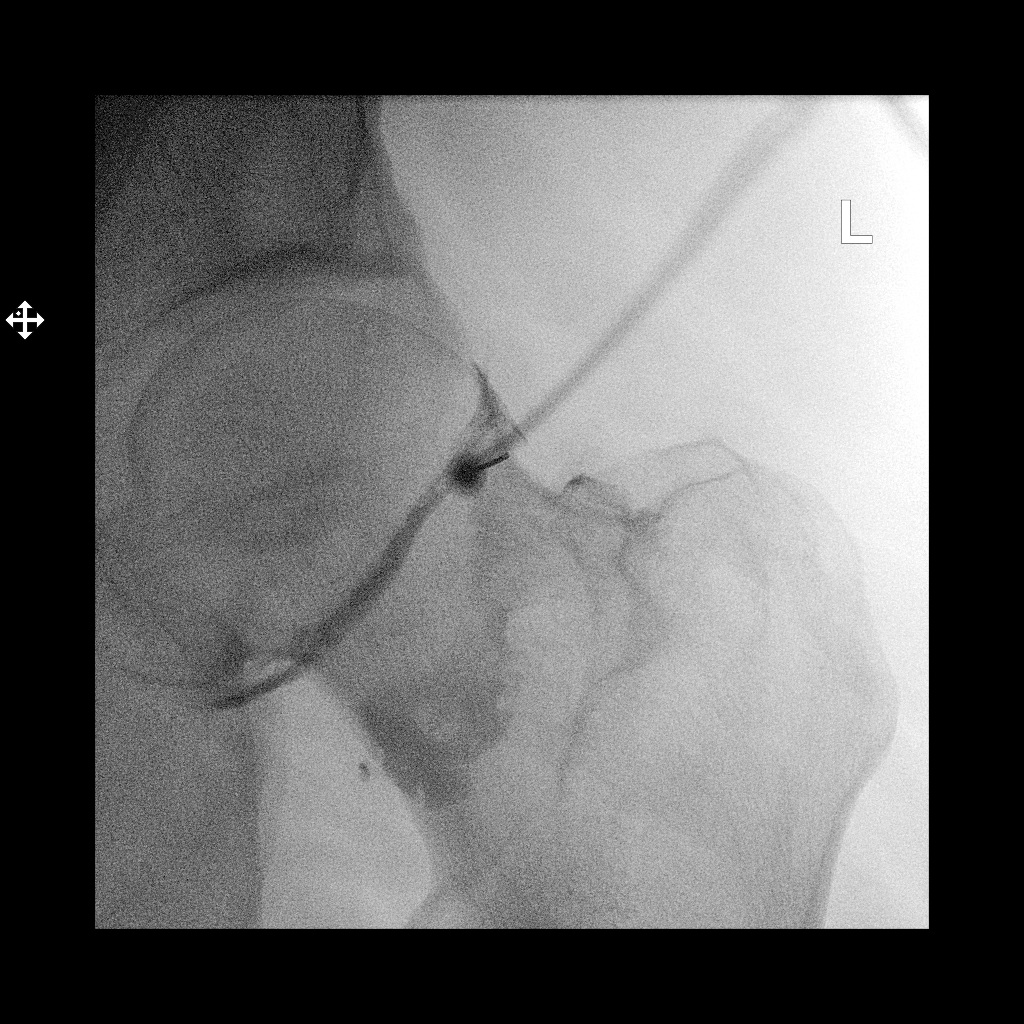

[1 of 1 positions shown; findings below may reference images not displayed]

EXAM:
LEFT HIP INJECTION UNDER FLUOROSCOPY

FLUOROSCOPY TIME:  31 seconds.  2.8 mGy.

PROCEDURE:
Overlying skin prepped with Betadine, draped in the usual sterile
fashion, and infiltrated locally with buffered Lidocaine. Curved 22
gauge spinal needle advanced to the superolateral margin of the left
femoral head. 1 ml of Lidocaine injected easily. Diagnostic
injection of iodinated contrastdemonstrates intra-articular spread
without intravascular component.

120mg Depo-Medrol and 2 cc 1% lidocaine were then administered.

COMPLICATIONS:
None
IMPRESSION: Technically successful left hip injection under fluoroscopy.

## 2019-08-17 ENCOUNTER — Other Ambulatory Visit: Payer: Self-pay

## 2019-08-17 ENCOUNTER — Ambulatory Visit: Payer: Federal, State, Local not specified - PPO | Admitting: Physical Therapy

## 2019-08-17 ENCOUNTER — Encounter: Payer: Self-pay | Admitting: Physical Therapy

## 2019-08-17 DIAGNOSIS — M6281 Muscle weakness (generalized): Secondary | ICD-10-CM | POA: Diagnosis not present

## 2019-08-17 DIAGNOSIS — M545 Low back pain, unspecified: Secondary | ICD-10-CM

## 2019-08-17 DIAGNOSIS — R29898 Other symptoms and signs involving the musculoskeletal system: Secondary | ICD-10-CM

## 2019-08-17 DIAGNOSIS — G8929 Other chronic pain: Secondary | ICD-10-CM | POA: Diagnosis not present

## 2019-08-17 NOTE — Therapy (Signed)
[place], [place], [address] Phone: [phone]   Fax:  [phone]  Physical Therapy Evaluation  Patient Details  Name: George Hays MRN: [id number] Date of Birth: 03-12-1966 Referring Provider (PT): Garald Balding, MD   Encounter Date: 08/17/2019  PT End of Session - 08/17/19 0941    Visit Number  1    PT Start Time  0845    PT Stop Time  0922    PT Time Calculation (min)  37 min    Activity Tolerance  Patient tolerated treatment well    Behavior During Therapy  [facility] for tasks assessed/performed       Past Medical History:  Diagnosis Date  . Arthritis   . GERD (gastroesophageal reflux disease)    chronic indigestion    Past Surgical History:  Procedure Laterality Date  . ADENOIDECTOMY    . 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 TONSILLECTOMY      There were no vitals filed for this visit.   Subjective Assessment - 08/17/19 0850    Subjective  Pt is a 54 y/o male who presents to OPPT for back and hip OA.  Pt feels he will eventually need THA but needs to lose more weigth prior to approval.  Cortisone injections help for approx 1 month.  Pt feels back is worse with lifting and carrying items.    Patient Stated Goals  improve pain and flexibility    Currently in Pain?  Yes    Pain Score  3     Pain Location  Back    Pain Orientation  Lower    Pain Descriptors / Indicators  Aching;Dull    Pain Type  Chronic pain   ~ 10 years   Pain Onset  More than a month ago    Pain Frequency  Intermittent    Aggravating Factors   lifting, carrying, bending    Pain Relieving Factors  sleep, TENS    Multiple Pain Sites  Yes    Pain Score  7    Pain Location  Hip    Pain Orientation  Left;Anterior    Pain Descriptors / Indicators  Aching;Dull;Throbbing    Pain Type  Chronic pain    Pain Onset  More than a month ago    Pain Frequency  Constant    Aggravating Factors    walking, twisting, lying on Lt side    Pain Relieving Factors  medication, injections         [facility] PT Assessment - 08/17/19 0856      Assessment   Medical Diagnosis  vaso and kT tape for edema  needs knee ROM and gentle progression of strength/standing toleance, gait, work on stairs when able     Referring Provider (PT)  Garald Balding, MD    Onset Date/Surgical Date  --   ~ 10 years   Hand Dominance  Right    Next MD Visit  PRN    Prior Therapy  none      Precautions   Precautions  None      Restrictions   Weight Bearing Restrictions  No      Balance Screen   Has the patient fallen in the past 6 months  No    Has the patient had a decrease in activity level because of a fear of falling?   No    Is the patient reluctant to  leave their home because of a fear of falling?   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    Leisure  video games; walking throughout day with work (2-2.5 miles)      Associate Professor   Overall Cognitive Status  Within Functional Limits for tasks assessed      Posture/Postural Control   Posture/Postural Control  Postural limitations      ROM / Strength   AROM / PROM / Strength  AROM;Strength      AROM   AROM Assessment Site  Lumbar    Lumbar Flexion  limited 20% - no change with repetition    Lumbar Extension  WNL - pain ant hip    Lumbar - Right Side Bend  WNL    Lumbar - Left Side Bend  WNL    Lumbar - Right Rotation  WNL    Lumbar - Left Rotation  WNL      Strength   Strength Assessment Site  Hip;Knee     Right/Left Hip  Right;Left    Right Hip Flexion  5/5    Left Hip Flexion  3-/5   painful motion   Right/Left Knee  Right;Left    Right Knee Flexion  5/5    Right Knee Extension  5/5    Left Knee Flexion  5/5    Left Knee Extension  4/5      Flexibility   Soft Tissue Assessment /Muscle Length  yes    Hamstrings  tightness bil    Quadriceps  tightness on LT   with tight hip flexors   Piriformis  tightness on Lt - difficulty with attempting to stretch due to hip pain      Palpation   Palpation comment  tenderness ant hip along hip flexors      Ambulation/Gait   Gait Pattern  Antalgic                Objective measurements completed on examination: See above findings.      [facility] Adult PT Treatment/Exercise - 08/17/19 0856      Exercises   Exercises  Lumbar      Lumbar Exercises: Stretches   Passive Hamstring Stretch  Left;1 rep;30 seconds    Single Knee to Chest Stretch  Left;1 rep;30 seconds   with strap   Hip Flexor Stretch  Left;1 rep;30 seconds    Hip Flexor Stretch Limitations  with quad stretch    Quad Stretch  Left;1 rep;30 seconds    Piriformis Stretch Limitations  attempted - unable due to hip pain; issued with HEP to try as he progresses      Lumbar Exercises: Supine   AB Set Limitations  1 rep x 5 sec hold for HEP instruction    Bridge Limitations  1 rep x 5 sec hold for HEP instruction             PT Education -  08/17/19 [id number]    Education Details  HEP    Person(s) Educated  Patient    Methods  Explanation;Demonstration;Handout;Verbal cues    Comprehension  Verbalized understanding;Returned demonstration                  Plan - 08/17/19 0915    Clinical Impression Statement  Pt is a 54 y/o male who presents to OPPT for chronic LBP.  One time session today to provide HEP to help with pain.  Exercises and mobility limited by severe OA in Lt hip, and feel exercises will help address these as well.  Pt demonstrates decreased  strength and flexibility so HEP issued to address deficits.    Personal Factors and Comorbidities  Comorbidity 2    Comorbidities  OA, obesity    Examination-Activity Limitations  Bend;Stairs;Carry;Lift    Examination-Participation Restrictions  Yard Work    Stability/Clinical Decision Making  Evolving/Moderate complexity    Clinical Decision Making  Moderate    Rehab Potential  Good    PT Frequency  One time visit    PT Treatment/Interventions  ADLs/Self Care Home Management;Patient/family education;Therapeutic exercise    PT Next Visit Plan  1x visit    PT Home Exercise Plan  Access Code: [email or web address]    Consulted and Agree with Plan of Care  Patient       Patient will benefit from skilled therapeutic intervention in order to improve the following deficits and impairments:  Abnormal gait, Impaired flexibility, Decreased range of motion, Decreased strength, Pain, Postural dysfunction  Visit Diagnosis: Chronic left-sided low back pain without sciatica - Plan: PT plan of care cert/re-cert  Muscle weakness (generalized) - Plan: PT plan of care cert/re-cert  Other symptoms and signs involving the musculoskeletal system - Plan: PT plan of care cert/re-cert     Problem List Patient Active Problem List   Diagnosis Date Noted  . Unilateral primary osteoarthritis, left hip 07/06/2019  . Obesity 05/08/2014  . DOE (dyspnea on exertion) 03/10/2013  . OSA (obstructive sleep apnea) 03/10/2013      Laureen Abrahams, PT, DPT 08/17/19 9:46 AM    [facility] [address] [place], [place], [address] Phone: [phone]   Fax:  [phone]  Name: George Hays MRN: [id number] Date of Birth: 09/03/1965

## 2019-08-17 NOTE — Patient Instructions (Signed)
Access Code: [email or web address]  URL: [email or web address]  Date: 08/17/2019  Prepared by: Faustino Congress   Exercises Supine Quadriceps Stretch with Strap on Table - 3 reps - 1 sets - 30 sec hold - 2-3x daily - 7x weekly Hook Lying Single Knee to Chest Stretch with Towel - 3 reps - 1 sets - 30 sec hold - 2-3x daily - 7x weekly Hooklying Hamstring Stretch with Strap - 3 reps - 1 sets - 30 sec hold - 2-3x daily - 7x weekly Supine Posterior Pelvic Tilt - 10 reps - 1 sets - 5 sec hold - 2x daily - 7x weekly Supine Bridge - 10 reps - 1 sets - 5 sec hold - 2x daily - 7x weekly Standing Hip Flexion - 10 reps - 1 sets - 2-3x daily - 7x weekly Supine Piriformis Stretch with Foot on Ground - 3 reps - 1 sets - 30 sec hold - 2-3x daily - 7x weekly Seated Piriformis Stretch - 3 reps - 1 sets - 30 sec hold - 2-3x daily - 7x weekly

## 2019-11-07 ENCOUNTER — Other Ambulatory Visit: Payer: Self-pay | Admitting: Family Medicine

## 2019-11-07 ENCOUNTER — Ambulatory Visit
Admission: RE | Admit: 2019-11-07 | Discharge: 2019-11-07 | Disposition: A | Discharge status: Home / Self Care | Payer: Federal, State, Local not specified - PPO | Source: Ambulatory Visit | Attending: Family Medicine | Admitting: Family Medicine

## 2019-11-07 DIAGNOSIS — R55 Syncope and collapse: Secondary | ICD-10-CM

## 2019-11-10 ENCOUNTER — Encounter: Payer: Self-pay | Admitting: Neurology

## 2019-12-05 IMAGING — XA DG FLUORO GUIDE NDL PLC/BX
1 series · 1 of 1 positions shown · IV contrast (isovue)
Comparison: 01/21/2018

CLINICAL DATA: Osteoarthritis of the left hip. One month of relief
from the previous injection but with recurrence of symptoms.

EXAM:
Left hip injection
TECHNIQUE: The overlying skin was prepped with Betadine, draped in the usual
sterile fashion, and infiltrated locally with 1% Lidocaine. A 22
gauge spinal needle was advanced under fluoroscopic observation to
the lateral femoral neck. Confirmatory injection of less than 1 ml
of Isovue 200 demonstrates intra-articular spread without
intravascular component.
120 mg Depo-Medrol and 3 ml 1% lidocaine were then instilled. The
procedure was well tolerated. The patient was observed for a short
period of time then discharged in good condition.
FLUOROSCOPY TIME:  0 minutes 21 seconds. 36.84 micro gray meter
squared

[Series 1: ortho adipose · 1 of 1 slices shown]
[im 1/1]
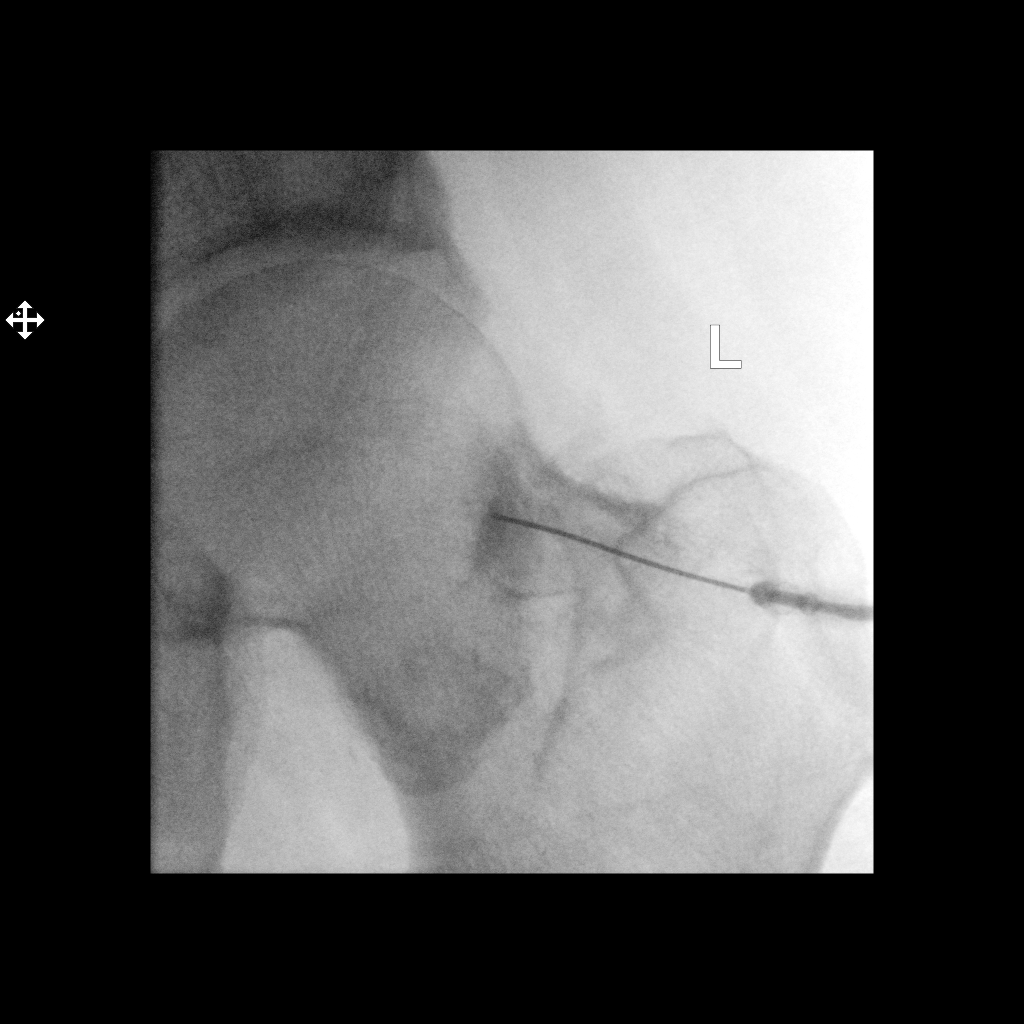

[1 of 1 positions shown; findings below may reference images not displayed]

IMPRESSION: Technically successful left hip injection under fluoroscopy.

## 2020-02-15 ENCOUNTER — Encounter: Payer: Self-pay | Admitting: Neurology

## 2020-02-15 ENCOUNTER — Other Ambulatory Visit: Payer: Self-pay

## 2020-02-15 ENCOUNTER — Ambulatory Visit: Payer: Federal, State, Local not specified - PPO | Admitting: Neurology

## 2020-02-15 VITALS — BP 111/71 | HR 71 | Ht 74.0 in | Wt 316.0 lb

## 2020-02-15 DIAGNOSIS — R55 Syncope and collapse: Secondary | ICD-10-CM | POA: Diagnosis not present

## 2020-02-15 DIAGNOSIS — R42 Dizziness and giddiness: Secondary | ICD-10-CM

## 2020-02-15 NOTE — Progress Notes (Signed)
NEUROLOGY CONSULTATION NOTE  George Hays MRN: [id number] DOB: 10-02-1965  Referring provider: Dr. Horald Pollen Primary care provider: Dr. Horald Pollen  Reason for consult:  syncope  Dear Dr Darron Doom:  Thank you for your kind referral of George Hays for consultation of the above symptoms. Although his history is well known to you, please allow me to reiterate it for the purpose of our medical record. The patient was accompanied to the clinic by his wife who also provides collateral information. Records and images were personally reviewed where available.   HISTORY OF PRESENT ILLNESS: This is a pleasant 54 year old right-handed man with a history of hypertension, diabetes, depression, presenting for evaluation after a syncopal episode last 11/06/2019. He recalls talking to his wife in the living, turning to go to the bedroom, then waking up on the bed. His wife reports hearing a thud and found him on the ground, he was unconscious for a minute, then it took him a while to get oriented, he refused calling EMG and crawled to the bed but does not remember doing this. He was a little confused, he was crying quite a bit/really emotional, which is unusual for him. He was disoriented, answering questions but some were a little off. It took him 30 minutes before he could answer her clearly. She stayed with him all night to make sure he was fine, asking him questions. The whole night and next day she noticed trouble focusing, he would be looking off in a distance and take a minute to register she was speaking to him. There was no associated tongue bite, incontinence, focal weakness. He felt tired with head pain. He saw Dr. Darron Doom with normal CBC, CMP. He recalls having an EKG done, results unavailable. He had a head CT without contrast on 11/07/19 which I personally reviewed, no acute changes, there was a small contusion in the right frontal scalp.  No further episodes of loss of consciousness since  10/2019. Prior to this, he was reporting dizzy spells diagnosed as vertigo. He had an MRI brain without contrast on 10/23/19 reporting no acute changes, very mild chronic microvascular disease. He saw ENT last week and had an extensive evaluation which was normal, they were told vertigo was due to dysfunction of a "region of the brain processing it." He has not had any further vertigo episodes since the end of May/early June. He has little bouts when he stands up quickly or turns too fast. He has also been having episodes where his lips start trembling or either arm/hand would jerk/twitch. These would last 1-2 minutes occurring a couple of times a week. It has affected writing and using a fork, and he would hold his hand with the other one. His wife provides additional information at the end of the visit that the twitching/jerking, dizzy spells, as well as the episode of loss of consciousness, all occur when they are having difficult conversations about their marriage. They have been seeing a marriage counselor, which has been helping.   She denies any staring/unresponsive episodes. He denies any olfactory/gustatory hallucinations, deja vu, rising epigastric sensation, focal numbness/tingling/weakness. No significant headaches, dysarthria/dysphagia, bowel/bladder dysfunction. He has low back pain. He has some numbness in the toes of his left foot since frostbite injury years ago. He takes gabapentin 300mg  TID for left hip pain. He usually gets 4-5 hours of sleep with his CPAP. He has been having issues with daytime drowsiness recently, they would be doing word games on their  phones then he drops it because he fell asleep. He has not seen his sleep specialist for a time now. His brother had epilepsy when younger. He had concussions in his 33s, no neurosurgical procedures. He had a normal birth and early development.  There is no history of febrile convulsions, CNS infections such as meningitis/encephalitis. 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    PAST MEDICAL HISTORY: Past Medical History:  Diagnosis Date  . Arthritis   . GERD (gastroesophageal reflux disease)    chronic indigestion  . Hypertension     PAST SURGICAL HISTORY: Past Surgical History:  Procedure Laterality Date  . ADENOIDECTOMY    . 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 TONSILLECTOMY      MEDICATIONS: Current Outpatient Medications on File Prior to Visit  Medication Sig Dispense Refill  . aspirin 81 MG tablet Take 81 mg by mouth daily.    Marland Kitchen buPROPion (WELLBUTRIN XL) 300 MG 24 hr tablet     . esomeprazole (NEXIUM) 40 MG capsule Take by mouth.    . gabapentin (NEURONTIN) 300 MG capsule Take 300 mg by mouth 3 (three) times daily.    Marland Kitchen glucosamine-chondroitin 500-400 MG tablet Take 1 tablet by mouth 3 (three) times daily.    Marland Kitchen lisinopril-hydrochlorothiazide (PRINZIDE,ZESTORETIC) 10-12.5 MG tablet     . meloxicam (MOBIC) 7.5 MG tablet     . metFORMIN (GLUCOPHAGE) 500 MG tablet Take by mouth once. Take one tablet once a day    . naproxen sodium (ALEVE) 220 MG tablet Take 220 mg by mouth.    . vortioxetine HBr (TRINTELLIX) 10 MG TABS tablet Take 10 mg by mouth daily.    Marland Kitchen doxycycline (VIBRA-TABS) 100 MG tablet Take 1 tablet (100 mg total) by mouth 2 (two) times daily. (Patient not taking: Reported on 08/17/2019) 20 tablet 0   No current facility-administered medications on file prior to visit.    ALLERGIES: No Known Allergies  FAMILY HISTORY: Family History  Problem Relation Age of Onset  . Diabetes Mother   . Lung cancer Paternal Grandmother   . Skin cancer Paternal Grandfather   . Allergies Other   . Cancer Other        great grandparents and aunts/uncles    SOCIAL HISTORY: Social History   Socioeconomic History  . Marital status: Single    Spouse name: Not on file  . Number of children: 1  . Years of education: Not on file  . Highest education level: Not on file   Occupational History  . Occupation: Engineer, production: [employer]  Tobacco Use  . Smoking status: Former Smoker    Packs/day: 4.00    Years: 15.00    Pack years: 60.00    Types: Cigarettes, Cigars    Quit date: 06/22/2002    Years since quitting: 17.6  . Smokeless tobacco: Never Used  . Tobacco comment: Currently smokes 2-3 cigars a year. FOR 8 YEARS PT SMOKED 3-4 PPD  Vaping Use  . Vaping Use: Never used  Substance and Sexual Activity  . Alcohol use: Yes    Comment: social  . Drug use: No  . Sexual activity: Not on file  Other Topics Concern  . Not on file  Social History Narrative   Right Handed   Lives in a two story home   Drinks caffeine    Social Determinants of Health   Financial Resource Strain:   .  Difficulty of Paying Living Expenses: Not on file  Food Insecurity:   . Worried About Charity fundraiser in the Last Year: Not on file  . Ran Out of Food in the Last Year: Not on file  Transportation Needs:   . Lack of Transportation (Medical): Not on file  . Lack of Transportation (Non-Medical): Not on file  Physical Activity:   . Days of Exercise per Week: Not on file  . Minutes of Exercise per Session: Not on file  Stress:   . Feeling of Stress : Not on file  Social Connections:   . Frequency of Communication with Friends and Family: Not on file  . Frequency of Social Gatherings with Friends and Family: Not on file  . Attends Religious Services: Not on file  . Active Member of Clubs or Organizations: Not on file  . Attends Archivist Meetings: Not on file  . Marital Status: Not on file  Intimate Partner Violence:   . Fear of Current or Ex-Partner: Not on file  . Emotionally Abused: Not on file  . Physically Abused: Not on file  . Sexually Abused: Not on file    PHYSICAL EXAM: Vitals:   02/15/20 0858  BP: 111/71  Pulse: 71  SpO2: 96%   General: No acute distress Head:  Normocephalic/atraumatic Skin/Extremities: No rash, no  edema Neurological Exam: Mental status: alert and oriented to person, place, and time, no dysarthria or aphasia, Fund of knowledge is appropriate.  Recent and remote memory are intact. 2/3 delayed recall.  Attention and concentration are normal.  5/5 WORLD backwards.  Cranial nerves: CN I: not tested CN II: pupils equal, round and reactive to light, visual fields intact CN III, IV, VI:  full range of motion, no nystagmus, no ptosis CN V: facial sensation intact CN VII: upper and lower face symmetric CN VIII: hearing intact to conversation Bulk & Tone: normal, no fasciculations. Motor: 5/5 on both UE, right LE. 3/5 left hip flexion and knee extension due to pain, 5/5 distally.  Sensation: intact to light touch, cold, pin, vibration and joint position sense.  No extinction to double simultaneous stimulation.  Romberg test negative Deep Tendon Reflexes: +2 on left UE, otherwise +1 throughout.  Cerebellar: no incoordination on finger to nose testing Gait: wide-based, favoring left leg due to left hip pain Tremor: none in office today  IMPRESSION: This is a pleasant 54 year old right-handed man with a history of hypertension, diabetes, depression, presenting for evaluation after a syncopal episode last 11/06/2019. He was also having dizzy spells, none since late May/early June. He also has body twitching/jerking occurring 2-3 times a week. We discussed different causes of syncope, and how it is not uncommon for emotional stress to trigger vasovagal syncope. His wife provided additional information at the end of the visit that the dizzy spells and twitching all occur during times of difficult marital relation conversations. MRI brain in 10/23/2019 was normal. From a neurological standpoint, since he had prolonged confusion after event (which may have been post-concussive), we discussed doing a 1-hour EEG. Since he continues to report twitching/jerking, we can do a 24-hour EEG if routine EEG is normal to  further classify these episodes. Consider cardiology evaluation for syncope if PCP feels the need. [place] driving laws were discussed with the patient, and he knows to stop driving after an episode of loss of consciousness until 6 months event-free. If EEG normal, follow-up as needed, they know to call for any changes.  Thank you for allowing me to participate in the care of this patient. Please do not hesitate to call for any questions or concerns.   Ellouise Newer, M.D.  CC: Dr. Darron Doom

## 2020-02-15 NOTE — Patient Instructions (Addendum)
Good to meet you!  1. Schedule 1-hour EEG, then plan for 24-hour EEG if normal  2. MRI brain from [place] will be requested   3. It is prudent to recommend that all persons should be free of syncopal episodes for at least six months to be granted the driving privilege." ([place], Second Edition, Medical Review Branch, Engineer, site, Division of [employer] of Transportation, July 2004)   4. Our office will call with results, if normal, follow-up as needed.

## 2020-02-29 ENCOUNTER — Ambulatory Visit (INDEPENDENT_AMBULATORY_CARE_PROVIDER_SITE_OTHER): Payer: Federal, State, Local not specified - PPO | Admitting: Neurology

## 2020-02-29 ENCOUNTER — Other Ambulatory Visit: Payer: Self-pay

## 2020-02-29 DIAGNOSIS — R55 Syncope and collapse: Secondary | ICD-10-CM

## 2020-02-29 DIAGNOSIS — R42 Dizziness and giddiness: Secondary | ICD-10-CM | POA: Diagnosis not present

## 2020-03-01 ENCOUNTER — Telehealth: Payer: Self-pay

## 2020-03-01 NOTE — Procedures (Signed)
ELECTROENCEPHALOGRAM REPORT  Date of Study: 02/29/2020  Patient's Name: George Hays MRN: [id number] Date of Birth: December 25, 1965  Referring Provider: Dr. Ellouise Newer  Clinical History: This is a 54 year old man with a syncopal episode in May, also with dizzy spells and body twitching/jerking. EEG for classification.  Medications: aspirin 81 MG tablet WELLBUTRIN XL 300 MG 24 hr tablet NEXIUM 40 MG capsule NEURONTIN 300 MG capsule glucosamine-chondroitin 500-400 MG tablet PRINZIDE,ZESTORETIC 10-12.5 MG tablet MOBIC 7.5 MG tablet GLUCOPHAGE 500 MG tablet ALEVE 220 MG tablet TRINTELLIX 10 MG TABS tablet VIBRA-TABS 100 MG tablet  Technical Summary: A multichannel digital 1-hour EEG recording measured by the international 10-20 system with electrodes applied with paste and impedances below 5000 ohms performed in our laboratory with EKG monitoring in an awake and asleep patient.  Hyperventilation was not performed. Photic stimulation was performed.  The digital EEG was referentially recorded, reformatted, and digitally filtered in a variety of bipolar and referential montages for optimal display.    Description: The patient is awake and asleep during the recording.  During maximal wakefulness, there is a symmetric, medium voltage 11 Hz posterior dominant rhythm that attenuates with eye opening.  The record is symmetric.  During drowsiness and sleep, there is an increase in theta slowing of the background.  Vertex waves and symmetric sleep spindles were seen.  Hyperventilation and photic stimulation did not elicit any abnormalities.  There were no epileptiform discharges or electrographic seizures seen.    EKG lead was unremarkable.  Impression: This 1-hour awake and asleep EEG is normal.     Clinical Correlation: A normal EEG does not exclude a clinical diagnosis of epilepsy.  If further clinical questions remain, prolonged EEG may be helpful.  Clinical correlation is advised.   Ellouise Newer, M.D.

## 2020-03-01 NOTE — Telephone Encounter (Signed)
Spoke with pt informed her that EEG was normal, proceed with prolonged EEG as discussed

## 2020-03-01 NOTE — Telephone Encounter (Signed)
-----   Message from Cameron Sprang, MD sent at 03/01/2020  1:01 PM EDT ----- Pls let him know the EEG was normal, proceed with prolonged EEG as discussed, thanks

## 2020-04-17 ENCOUNTER — Other Ambulatory Visit: Payer: Federal, State, Local not specified - PPO

## 2020-05-02 ENCOUNTER — Other Ambulatory Visit: Payer: Self-pay

## 2020-05-02 ENCOUNTER — Ambulatory Visit: Payer: Federal, State, Local not specified - PPO | Admitting: Neurology

## 2020-05-02 DIAGNOSIS — R55 Syncope and collapse: Secondary | ICD-10-CM

## 2020-05-02 DIAGNOSIS — R42 Dizziness and giddiness: Secondary | ICD-10-CM | POA: Diagnosis not present

## 2020-05-15 NOTE — Procedures (Signed)
ELECTROENCEPHALOGRAM REPORT  Dates of Recording: 05/02/2020 9:27AM to 05/03/2020 9:39AM  Patient's Name: George Hays MRN: [id number] Date of Birth: [birth year]  Referring Provider: Dr. Ellouise Newer  Procedure: 24-hour ambulatory video EEG  History: This is a 54 year old man with a syncopal episode in May 2021, with recurrent dizzy spells, body twitching/jerking.   Medications:  aspirin 81 MG tablet WELLBUTRIN XL 300 MG 24 hr tablet NEXIUM 40 MG capsule NEURONTIN 300 MG capsule glucosamine-chondroitin 500-400 MG tablet PRINZIDE,ZESTORETIC 10-12.5 MG tablet MOBIC 7.5 MG tablet GLUCOPHAGE 500 MG tablet ALEVE 220 MG tablet TRINTELLIX 10 MG TABS tablet VIBRA-TABS 100 MG tablet  Technical Summary: This is a 24-hour multichannel digital video EEG recording measured by the international 10-20 system with electrodes applied with paste and impedances below 5000 ohms performed as portable with EKG monitoring.  The digital EEG was referentially recorded, reformatted, and digitally filtered in a variety of bipolar and referential montages for optimal display.    DESCRIPTION OF RECORDING: During maximal wakefulness, the background activity consisted of a symmetric 11 Hz posterior dominant rhythm which was reactive to eye opening.  There were no epileptiform discharges or focal slowing seen in wakefulness.  During the recording, the patient progresses through wakefulness, drowsiness, and Stage 2 sleep.  Again, there were no epileptiform discharges seen.  Events: On 11/11 at 2032 hours, he has a headache. Patient not on video. Electrographically, there were no EEG or EKG changes seen.  There were no electrographic seizures seen.  EKG lead was unremarkable.   IMPRESSION: This 24-hour ambulatory video EEG study is normal.    CLINICAL CORRELATION: A normal EEG does not exclude a clinical diagnosis of epilepsy. Typical events were not reported. Headache did not show any EEG correlate.  If  further clinical questions remain, inpatient video EEG monitoring may be helpful.   Ellouise Newer, M.D.

## 2020-05-20 ENCOUNTER — Telehealth: Payer: Self-pay | Admitting: Orthopaedic Surgery

## 2020-05-20 ENCOUNTER — Telehealth: Payer: Self-pay

## 2020-05-20 NOTE — Telephone Encounter (Signed)
-----   Message from Cameron Sprang, MD sent at 05/16/2020 12:35 AM EST ----- Pls let him know the 24-hour EEG was normal, thanks

## 2020-05-20 NOTE — Telephone Encounter (Signed)
Pt called and would like Dr. Durward Fortes to set him up to get another inj in his hip.

## 2020-05-20 NOTE — Telephone Encounter (Signed)
Please advise 

## 2020-05-20 NOTE — Telephone Encounter (Signed)
Ok for intra articular injection left hip either Dr Ernestina Patches or [facility] radiology

## 2020-05-20 NOTE — Telephone Encounter (Signed)
Pt called and informed that 24-hour EEG was normal

## 2020-05-21 ENCOUNTER — Other Ambulatory Visit: Payer: Self-pay

## 2020-05-21 DIAGNOSIS — M25552 Pain in left hip: Secondary | ICD-10-CM

## 2020-05-21 NOTE — Telephone Encounter (Signed)
Called and notified patient that injection has been ordered for [facility] to perform.

## 2020-05-30 ENCOUNTER — Ambulatory Visit
Admission: RE | Admit: 2020-05-30 | Discharge: 2020-05-30 | Disposition: A | Discharge status: Home / Self Care | Payer: Federal, State, Local not specified - PPO | Source: Ambulatory Visit | Attending: Orthopaedic Surgery | Admitting: Orthopaedic Surgery

## 2020-05-30 DIAGNOSIS — M25552 Pain in left hip: Secondary | ICD-10-CM

## 2020-05-30 MED ORDER — IOPAMIDOL (ISOVUE-M 200) INJECTION 41%
1.0000 mL | Freq: Once | INTRAMUSCULAR | Status: AC
  Administered 2020-05-30: 1 mL via INTRA_ARTICULAR

## 2020-05-30 MED ORDER — METHYLPREDNISOLONE ACETATE 40 MG/ML INJ SUSP (RADIOLOG
120.0000 mg | Freq: Once | INTRAMUSCULAR | Status: AC
  Administered 2020-05-30: 120 mg via INTRA_ARTICULAR

## 2020-07-18 ENCOUNTER — Ambulatory Visit: Payer: Self-pay

## 2020-07-18 ENCOUNTER — Ambulatory Visit: Payer: Federal, State, Local not specified - PPO | Admitting: Orthopaedic Surgery

## 2020-07-18 ENCOUNTER — Encounter: Payer: Self-pay | Admitting: Orthopaedic Surgery

## 2020-07-18 ENCOUNTER — Other Ambulatory Visit: Payer: Self-pay

## 2020-07-18 VITALS — Ht 74.0 in | Wt 309.4 lb

## 2020-07-18 DIAGNOSIS — M1612 Unilateral primary osteoarthritis, left hip: Secondary | ICD-10-CM | POA: Diagnosis not present

## 2020-07-18 DIAGNOSIS — E6609 Other obesity due to excess calories: Secondary | ICD-10-CM | POA: Diagnosis not present

## 2020-07-18 DIAGNOSIS — M25552 Pain in left hip: Secondary | ICD-10-CM | POA: Diagnosis not present

## 2020-07-18 DIAGNOSIS — Z6839 Body mass index (BMI) 39.0-39.9, adult: Secondary | ICD-10-CM

## 2020-07-18 NOTE — Progress Notes (Signed)
Office Visit Note   Patient: George Hays           Date of Birth: 1965-11-20           MRN: [id number] Visit Date: 07/18/2020              Requested by: Hayden Rasmussen, MD [address] [place],  [place] [address] PCP: Hayden Rasmussen, MD   Assessment & Plan: Visit Diagnoses:  1. Pain in left hip   2. Unilateral primary osteoarthritis, left hip     Plan: Mr. Prisk has been followed for the osteoarthritis of his left hip.  Over the past 4 years he has had progressive loss of the femoral acetabular joint space.  He is presently having a very difficult time performing his activities of daily living.  He had a cortisone injection in his left hip last month that provided little if any relief.  We had a long discussion today regarding his hip and I think he is a good candidate for total hip replacement.  I will refer him to Dr. Ninfa Linden for consideration of an anterior approach.  He had a list of well over 20 questions which I have answered in detail for him over period of about 40 minutes or more.  His wife was on a phone calls if she had any questions.  Follow-Up Instructions: Return Refer to Dr. Ninfa Linden for anterior approach left total hip replacement.   Orders:  Orders Placed This Encounter  Procedures  . XR HIP UNILAT W OR W/O PELVIS 2-3 VIEWS LEFT   No orders of the defined types were placed in this encounter.     Procedures: No procedures performed   Clinical Data: No additional findings.   Subjective: Chief Complaint  Patient presents with  . Left Hip - Pain  Mr. Roark has been followed for a number of years in reference to his left hip.  He has lost some weight and his present BMI is 39.7.  He has had a cortisone injection of his left hip in December 2021 that provided little if any relief.  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  HENT:     Mouth/Throat:     Mouth: Oropharynx is clear and moist.  Eyes:     Extraocular Movements: EOM normal.     Pupils: Pupils are equal, round, and reactive to light.  Pulmonary:     Effort: Pulmonary effort is normal.  Skin:    General: Skin is warm and dry.  Neurological:     Mental Status: He is alert and oriented to person, place, and time.  Psychiatric:        Mood and Affect: Mood and affect normal.        Behavior: Behavior normal.     Ortho Exam awake alert and oriented x3.  Comfortable sitting.  Obviously stiff when he first gets up in sitting position and has a limp referable to his left hip.  Has little if any motion from neutral left hip.  Might be a quarter of an inch  short on the left side.  Neurologically appears to be intact.  No edema.  Straight leg raise negative.  Painless percussion lumbar spine Specialty Comments:  No specialty comments available.  Imaging: XR HIP UNILAT W OR W/O PELVIS 2-3 VIEWS LEFT  Result Date: 07/18/2020 AP pelvis and lateral left hip were performed today and compared to films from 2017 and from early 2021.  There are progressive degenerative changes in the left hip with little if any superior joint space remaining.  There are subchondral cysts in the femoral head and osteophyte formation.  Films are consistent with advanced osteoarthritis.    PMFS History: Patient Active Problem List   Diagnosis Date Noted  . Unilateral primary osteoarthritis, left hip 07/06/2019  . Obesity 05/08/2014  . DOE (dyspnea on exertion) 03/10/2013  . OSA (obstructive sleep apnea) 03/10/2013   Past Medical History:  Diagnosis Date  . Arthritis   . GERD (gastroesophageal reflux disease)    chronic indigestion  . Hypertension      Family History  Problem Relation Age of Onset  . Diabetes Mother   . Lung cancer Paternal Grandmother   . Skin cancer Paternal Grandfather   . Allergies Other   . Cancer Other        great grandparents and aunts/uncles    Past Surgical History:  Procedure Laterality Date  . ADENOIDECTOMY    . 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 TONSILLECTOMY     Social History   Occupational History  . Occupation: Engineer, production: [employer]  Tobacco Use  . Smoking status: Former Smoker    Packs/day: 4.00    Years: 15.00    Pack years: 60.00    Types: Cigarettes, Cigars    Quit date: 06/22/2002    Years since quitting: 18.0  . Smokeless tobacco: Never Used  . Tobacco comment: Currently smokes 2-3 cigars a year. FOR 8 YEARS PT SMOKED 3-4 PPD  Vaping Use  . Vaping Use: Never used  Substance and Sexual Activity  . Alcohol use: Yes    Comment: social  . Drug use: No  . Sexual activity: Not on file     Garald Balding, MD   Note - This record has been created using [employer].  Chart creation errors have been sought, but may not always  have been located. Such creation errors do not reflect on  the standard of medical care.

## 2020-08-01 ENCOUNTER — Ambulatory Visit: Payer: Federal, State, Local not specified - PPO | Admitting: Orthopaedic Surgery

## 2020-08-01 VITALS — Ht 74.0 in | Wt 309.4 lb

## 2020-08-01 DIAGNOSIS — M1612 Unilateral primary osteoarthritis, left hip: Secondary | ICD-10-CM | POA: Diagnosis not present

## 2020-08-01 NOTE — Progress Notes (Signed)
Office Visit Note   Patient: George Hays           Date of Birth: 08-23-1965           MRN: [id number] Visit Date: 08/01/2020              Requested by: Hayden Rasmussen, MD [address] [place],  [place] [address] PCP: Hayden Rasmussen, MD   Assessment & Plan: Visit Diagnoses:  1. Unilateral primary osteoarthritis, left hip     Plan: The patient does have severe end-stage arthritis of his left hip.  I agree with the need for hip replacement surgery at this point.  There is really no other conservative treatment options.  I had a long and thorough discussion with him about the surgery.  I went over these x-rays.  I described what the surgery involves including a description of the interoperative and postoperative course.  I showed him a hip model and gave him a handout about hip replacement surgery.  We discussed the risk and benefits of the surgery as well.  I felt comfortable with proceeding with scheduling the surgery as to see.  All questions and concerns were answered and addressed.  We will be in touch about scheduling his surgery.  Follow-Up Instructions: Return for 2 weeks post-op.   Orders:  No orders of the defined types were placed in this encounter.  No orders of the defined types were placed in this encounter.     Procedures: No procedures performed   Clinical Data: No additional findings.   Subjective: Chief Complaint  Patient presents with  . Left Hip - Pain  The patient is someone I am seeing for the first time but he has been seen by my partner Dr. Durward Fortes.  He has been dealing with left hip pain is been worsening for several years now.  At this point his left hip pain is detrimentally affecting his mobility, his quality of life and his activities day living.  He is only a prediabetic.  He does have a BMI of 39.72.  He is taking a weight loss medication and has lost a significant amount of weight.  At this point his hip pain can be 10 out of  10 and he is in need of hip replacement surgery.  He does have x-rays in the [facility] system showing severe end-stage arthritis of the left hip.  He would like to discuss hip replacement surgery today.  He has had no other acute changes in medical status at all.  He is otherwise a very healthy 55 year old gentleman.  He does have some high blood pressure but no significant issues otherwise.  HPI  Review of Systems There is currently listed no headache, chest pain, shortness of breath, fever, chills, nausea, vomiting  Objective: Vital Signs: Ht 6\' 2"  (1.88 m)   Wt (!) 309 lb 6.4 oz (140.3 kg)   BMI 39.72 kg/m   Physical Exam He is alert and orient x3 and in no acute distress Ortho Exam Examination of his right hip is normal.  Examination of his left hip shows significant limitations with any internal and external rotation as well as severe pain with attempts of rotation of that hip.  He also has a slight ligament discrepancy with him laying supine.  His left side is slightly shorter than the right side.  When I do have him lay supine I see an easy plane with getting to his hip through an anterior  approach. Specialty Comments:  No specialty comments available.  Imaging: No results found. An AP pelvis and lateral of the left hip independently reviewed show severe end-stage arthritis of the left hip.  There is complete loss of the joint space.  There are periarticular osteophytes as well as sclerotic changes and cystic changes throughout the joint.  PMFS History: Patient Active Problem List   Diagnosis Date Noted  . Unilateral primary osteoarthritis, left hip 07/06/2019  . Obesity 05/08/2014  . DOE (dyspnea on exertion) 03/10/2013  . OSA (obstructive sleep apnea) 03/10/2013   Past Medical History:  Diagnosis Date  . Arthritis   . GERD (gastroesophageal reflux disease)    chronic indigestion  . Hypertension     Family History  Problem Relation Age of Onset  . Diabetes Mother   .  Lung cancer Paternal Grandmother   . Skin cancer Paternal Grandfather   . Allergies Other   . Cancer Other        great grandparents and aunts/uncles    Past Surgical History:  Procedure Laterality Date  . ADENOIDECTOMY    . 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 TONSILLECTOMY     Social History   Occupational History  . Occupation: Engineer, production: [employer]  Tobacco Use  . Smoking status: Former Smoker    Packs/day: 4.00    Years: 15.00    Pack years: 60.00    Types: Cigarettes, Cigars    Quit date: 06/22/2002    Years since quitting: 18.1  . Smokeless tobacco: Never Used  . Tobacco comment: Currently smokes 2-3 cigars a year. FOR 8 YEARS PT SMOKED 3-4 PPD  Vaping Use  . Vaping Use: Never used  Substance and Sexual Activity  . Alcohol use: Yes    Comment: social  . Drug use: No  . Sexual activity: Not on file

## 2020-08-26 ENCOUNTER — Other Ambulatory Visit: Payer: Self-pay

## 2020-08-30 ENCOUNTER — Telehealth: Payer: Self-pay | Admitting: Orthopaedic Surgery

## 2020-08-30 NOTE — Telephone Encounter (Signed)
Received medical records release form, $25.00 cash and disability/fmla forms from patient    Forwarding to [facility] today

## 2020-09-17 ENCOUNTER — Other Ambulatory Visit: Payer: Self-pay | Admitting: Physician Assistant

## 2020-09-19 NOTE — Patient Instructions (Addendum)
DUE TO COVID-19 ONLY ONE VISITOR IS ALLOWED TO COME WITH YOU AND STAY IN THE WAITING ROOM ONLY DURING PRE OP AND PROCEDURE DAY OF SURGERY. THE 1 VISITOR  MAY VISIT WITH YOU AFTER SURGERY IN YOUR PRIVATE ROOM DURING VISITING HOURS ONLY!  YOU NEED TO HAVE A COVID 19 TEST ON: 09/24/20 @ 3:10 PM, THIS TEST MUST BE DONE BEFORE SURGERY,  COVID TESTING SITE [place] [address], IT IS ON THE RIGHT GOING OUT [address] APPROXIMATELY  2 MINUTES PAST [employer] ON THE RIGHT. ONCE YOUR COVID TEST IS COMPLETED,  PLEASE BEGIN THE QUARANTINE INSTRUCTIONS AS OUTLINED IN YOUR HANDOUT.                George Hays George Hays   Your procedure is scheduled on: 09/27/20   Report to [facility] Main  Entrance   Report to admitting at: 7:15 AM     Call this number if you have problems the morning of surgery [phone]    Remember:  NO SOLID FOOD AFTER MIDNIGHT THE NIGHT PRIOR TO SURGERY. NOTHING BY MOUTH EXCEPT CLEAR LIQUIDS UNTIL: 6:45 AM . PLEASE FINISH ENSURE DRINK PER SURGEON ORDER  WHICH NEEDS TO BE COMPLETED AT : 6:45 AM.  CLEAR LIQUID DIET  Foods Allowed                                                                     Foods Excluded  Coffee and tea, regular and decaf                             liquids that you cannot  Plain Jell-O any favor except red or purple                                           see through such as: Fruit ices (not with fruit pulp)                                     milk, soups, orange juice  Iced Popsicles                                    All solid food Carbonated beverages, regular and diet                                    Cranberry, grape and apple juices Sports drinks like Gatorade Lightly seasoned clear broth or consume(fat free) Sugar, honey syrup  Sample Menu Breakfast                                Lunch  Supper Cranberry juice                    Beef broth                             Chicken broth Jell-O                                     Grape juice                           Apple juice Coffee or tea                        Jell-O                                      Popsicle                                                Coffee or tea                        Coffee or tea  _____________________________________________________________________   BRUSH YOUR TEETH MORNING OF SURGERY AND RINSE YOUR MOUTH OUT, NO CHEWING GUM CANDY OR MINTS.    Take these medicines the morning of surgery with A SIP OF WATER:bupropion,esomeprazole,gabapentin,anastrazole,vortioxetine,                                You may not have any metal on your body including hair pins and              piercings  Do not wear jewelry, lotions, powders or perfumes, deodorant             Men may shave face and neck.   Do not bring valuables to the hospital. [place].  Contacts, dentures or bridgework may not be worn into surgery.  Leave suitcase in the car. After surgery it may be brought to your room.     Patients discharged the day of surgery will not be allowed to drive home. IF YOU ARE HAVING SURGERY AND GOING HOME THE SAME DAY, YOU MUST HAVE AN ADULT TO DRIVE YOU HOME AND BE WITH YOU FOR 24 HOURS. YOU MAY GO HOME BY TAXI OR UBER OR ORTHERWISE, BUT AN ADULT MUST ACCOMPANY YOU HOME AND STAY WITH YOU FOR 24 HOURS.  Name and phone number of your driver:  Special Instructions: N/A              Please read over the following fact sheets you were given: _____________________________________________________________________          [facility] - Preparing for Surgery Before surgery, you can play an important role.  Because skin is not sterile, your skin needs to be as free of germs as possible.  You can reduce the number of germs on your skin by washing with CHG (chlorahexidine  gluconate) soap before surgery.  CHG is an antiseptic cleaner which kills germs and  bonds with the skin to continue killing germs even after washing. Please DO NOT use if you have an allergy to CHG or antibacterial soaps.  If your skin becomes reddened/irritated stop using the CHG and inform your nurse when you arrive at Short Stay. Do not shave (including legs and underarms) for at least 48 hours prior to the first CHG shower.  You may shave your face/neck. Please follow these instructions carefully:  1.  Shower with CHG Soap the night before surgery and the  morning of Surgery.  2.  If you choose to wash your hair, wash your hair first as usual with your  normal  shampoo.  3.  After you shampoo, rinse your hair and body thoroughly to remove the  shampoo.                           4.  Use CHG as you would any other liquid soap.  You can apply chg directly  to the skin and wash                       Gently with a scrungie or clean washcloth.  5.  Apply the CHG Soap to your body ONLY FROM THE NECK DOWN.   Do not use on face/ open                           Wound or open sores. Avoid contact with eyes, ears mouth and genitals (private parts).                       Wash face,  Genitals (private parts) with your normal soap.             6.  Wash thoroughly, paying special attention to the area where your surgery  will be performed.  7.  Thoroughly rinse your body with warm water from the neck down.  8.  DO NOT shower/wash with your normal soap after using and rinsing off  the CHG Soap.                9.  Pat yourself dry with a clean towel.            10.  Wear clean pajamas.            11.  Place clean sheets on your bed the night of your first shower and do not  sleep with pets. Day of Surgery : Do not apply any lotions/deodorants the morning of surgery.  Please wear clean clothes to the hospital/surgery center.  FAILURE TO FOLLOW THESE INSTRUCTIONS MAY RESULT IN THE CANCELLATION OF YOUR SURGERY PATIENT SIGNATURE_________________________________  NURSE  SIGNATURE__________________________________  ________________________________________________________________________   George Hays  An incentive spirometer is a tool that can help keep your lungs clear and active. This tool measures how well you are filling your lungs with each breath. Taking long deep breaths may help reverse or decrease the chance of developing breathing (pulmonary) problems (especially infection) following:  A long period of time when you are unable to move or be active. BEFORE THE PROCEDURE   If the spirometer includes an indicator to show your best effort, your nurse or respiratory therapist will set it to a desired goal.  If possible, sit up straight or lean slightly  forward. Try not to slouch.  Hold the incentive spirometer in an upright position. INSTRUCTIONS FOR USE  1. Sit on the edge of your bed if possible, or sit up as far as you can in bed or on a chair. 2. Hold the incentive spirometer in an upright position. 3. Breathe out normally. 4. Place the mouthpiece in your mouth and seal your lips tightly around it. 5. Breathe in slowly and as deeply as possible, raising the piston or the ball toward the top of the column. 6. Hold your breath for 3-5 seconds or for as long as possible. Allow the piston or ball to fall to the bottom of the column. 7. Remove the mouthpiece from your mouth and breathe out normally. 8. Rest for a few seconds and repeat Steps 1 through 7 at least 10 times every 1-2 hours when you are awake. Take your time and take a few normal breaths between deep breaths. 9. The spirometer may include an indicator to show your best effort. Use the indicator as a goal to work toward during each repetition. 10. After each set of 10 deep breaths, practice coughing to be sure your lungs are clear. If you have an incision (the cut made at the time of surgery), support your incision when coughing by placing a pillow or rolled up towels firmly  against it. Once you are able to get out of bed, walk around indoors and cough well. You may stop using the incentive spirometer when instructed by your caregiver.  RISKS AND COMPLICATIONS  Take your time so you do not get dizzy or light-headed.  If you are in pain, you may need to take or ask for pain medication before doing incentive spirometry. It is harder to take a deep breath if you are having pain. AFTER USE  Rest and breathe slowly and easily.  It can be helpful to keep track of a log of your progress. Your caregiver can provide you with a simple table to help with this. If you are using the spirometer at home, follow these instructions: [place] IF:   You are having difficultly using the spirometer.  You have trouble using the spirometer as often as instructed.  Your pain medication is not giving enough relief while using the spirometer.  You develop fever of 100.5 F (38.1 C) or higher. SEEK IMMEDIATE MEDICAL CARE IF:   You cough up bloody sputum that had not been present before.  You develop fever of 102 F (38.9 C) or greater.  You develop worsening pain at or near the incision site. MAKE SURE YOU:   Understand these instructions.  Will watch your condition.  Will get help right away if you are not doing well or get worse. Document Released: 10/19/2006 Document Revised: 08/31/2011 Document Reviewed: 12/20/2006 [facility] Patient Information 2014 [place], [place].   ________________________________________________________________________

## 2020-09-20 ENCOUNTER — Encounter (HOSPITAL_COMMUNITY)
Admission: RE | Admit: 2020-09-20 | Discharge: 2020-09-20 | Disposition: A | Discharge status: Home / Self Care | Payer: Federal, State, Local not specified - PPO | Source: Ambulatory Visit | Attending: Orthopaedic Surgery | Admitting: Orthopaedic Surgery

## 2020-09-20 ENCOUNTER — Other Ambulatory Visit: Payer: Self-pay

## 2020-09-20 ENCOUNTER — Encounter (HOSPITAL_COMMUNITY): Payer: Self-pay

## 2020-09-20 DIAGNOSIS — Z01818 Encounter for other preprocedural examination: Secondary | ICD-10-CM | POA: Insufficient documentation

## 2020-09-20 HISTORY — DX: Anemia, unspecified: D64.9

## 2020-09-20 HISTORY — DX: Prediabetes: R73.03

## 2020-09-20 LAB — BASIC METABOLIC PANEL WITH GFR
Anion gap: 9 (ref 5–15)
BUN: 15 mg/dL (ref 6–20)
CO2: 28 mmol/L (ref 22–32)
Calcium: 9.2 mg/dL (ref 8.9–10.3)
Chloride: 103 mmol/L (ref 98–111)
Creatinine, Ser: 1.12 mg/dL (ref 0.61–1.24)
GFR, Estimated: >60 mL/min
Glucose, Bld: 92 mg/dL (ref 70–99)
Potassium: 4 mmol/L (ref 3.5–5.1)
Sodium: 140 mmol/L (ref 135–145)

## 2020-09-20 LAB — CBC
HCT: 41.2 % (ref 39.0–52.0)
Hemoglobin: 13.5 g/dL (ref 13.0–17.0)
MCH: 29.3 pg (ref 26.0–34.0)
MCHC: 32.8 g/dL (ref 30.0–36.0)
MCV: 89.6 fL (ref 80.0–100.0)
Platelets: 334 10*3/uL (ref 150–400)
RBC: 4.6 MIL/uL (ref 4.22–5.81)
RDW: 12.5 % (ref 11.5–15.5)
WBC: 5.9 10*3/uL (ref 4.0–10.5)
nRBC: 0 % (ref 0.0–0.2)

## 2020-09-20 LAB — HEMOGLOBIN A1C
Hgb A1c MFr Bld: 5.5 % (ref 4.8–5.6)
Mean Plasma Glucose: 111.15 mg/dL

## 2020-09-20 LAB — SURGICAL PCR SCREEN
MRSA, PCR: NEGATIVE
Staphylococcus aureus: NEGATIVE

## 2020-09-20 NOTE — Progress Notes (Addendum)
COVID Vaccine Completed:Yes Date COVID Vaccine completed: 03/2020 COVID vaccine manufacturer: Pfizer     PCP - Dr. Horald Pollen Cardiologist -   Chest x-ray -  EKG -  Stress Test -  ECHO -  Cardiac Cath -  Pacemaker/ICD device last checked:  Sleep Study - Yes CPAP - Yes  Fasting Blood Sugar -  Checks Blood Sugar _____ times a day  Blood Thinner Instructions: Aspirin Instructions: Last Dose:  Anesthesia review: HTN,OSA(CPAP)  Patient denies shortness of breath, fever, cough and chest pain at PAT appointment   Patient verbalized understanding of instructions that were given to them at the PAT appointment. Patient was also instructed that they will need to review over the PAT instructions again at home before surgery.

## 2020-09-24 ENCOUNTER — Telehealth: Payer: Self-pay | Admitting: Orthopaedic Surgery

## 2020-09-24 NOTE — Telephone Encounter (Signed)
Patient called advised he just received a call from a (PT) group advising him that they were coming to his home to do (PT) after he goes home from the hospital. Patient said Dr. Ninfa Linden told him that he would not need (PT) unless something went wrong.   The number to contact patient is [phone]

## 2020-09-24 NOTE — Telephone Encounter (Signed)
Tell him I ment he would not need outpatient PT.  Having a little therapy at home for a few visits once he goes home can be helpful.

## 2020-09-25 ENCOUNTER — Other Ambulatory Visit (HOSPITAL_COMMUNITY)
Admission: RE | Admit: 2020-09-25 | Discharge: 2020-09-25 | Disposition: A | Discharge status: Home / Self Care | Payer: Federal, State, Local not specified - PPO | Source: Ambulatory Visit | Attending: Orthopaedic Surgery | Admitting: Orthopaedic Surgery

## 2020-09-25 DIAGNOSIS — Z20822 Contact with and (suspected) exposure to covid-19: Secondary | ICD-10-CM | POA: Insufficient documentation

## 2020-09-25 DIAGNOSIS — Z01812 Encounter for preprocedural laboratory examination: Secondary | ICD-10-CM | POA: Diagnosis present

## 2020-09-25 LAB — SARS CORONAVIRUS 2 (TAT 6-24 HRS): SARS Coronavirus 2: NEGATIVE

## 2020-09-25 NOTE — Telephone Encounter (Signed)
Called pt and advised and he stated understanding

## 2020-09-26 ENCOUNTER — Encounter (HOSPITAL_COMMUNITY): Payer: Self-pay | Admitting: Orthopaedic Surgery

## 2020-09-26 MED ORDER — DEXTROSE 5 % IV SOLN
3.0000 g | INTRAVENOUS | Status: AC
  Administered 2020-09-27: 2 g via INTRAVENOUS
  Filled 2020-09-26: qty 3

## 2020-09-26 NOTE — H&P (Signed)
TOTAL HIP ADMISSION H&P  Patient is admitted for left total hip arthroplasty.  Subjective:  Chief Complaint: left hip pain  HPI: George Hays, 55 y.o. male, has a history of pain and functional disability in the left hip(s) due to arthritis and patient has failed non-surgical conservative treatments for greater than 12 weeks to include NSAID's and/or analgesics, flexibility and strengthening excercises, weight reduction as appropriate and activity modification.  Onset of symptoms was gradual starting 4 years ago with gradually worsening course since that time.The patient noted no past surgery on the left hip(s).  Patient currently rates pain in the left hip at 10 out of 10 with activity. Patient has night pain, worsening of pain with activity and weight bearing, pain that interfers with activities of daily living and pain with passive range of motion. Patient has evidence of subchondral sclerosis, periarticular osteophytes and joint space narrowing by imaging studies. This condition presents safety issues increasing the risk of falls.  There is no current active infection.  Patient Active Problem List   Diagnosis Date Noted  . Unilateral primary osteoarthritis, left hip 07/06/2019  . Obesity 05/08/2014  . DOE (dyspnea on exertion) 03/10/2013  . OSA (obstructive sleep apnea) 03/10/2013   Past Medical History:  Diagnosis Date  . Anemia   . Arthritis   . GERD (gastroesophageal reflux disease)    chronic indigestion  . Hypertension   . Pre-diabetes     Past Surgical History:  Procedure Laterality Date  . ADENOIDECTOMY    . 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 TONSILLECTOMY      Current Facility-Administered Medications  Medication Dose Route Frequency Provider Last Rate Last Admin  . [START ON 09/27/2020] ceFAZolin (ANCEF) 3 g in dextrose 5 % 50 mL IVPB  3 g Intravenous On Call to Tyrone, MD       Current Outpatient  Medications  Medication Sig Dispense Refill Last Dose  . anastrozole (ARIMIDEX) 1 MG tablet Take 1 mg by mouth daily.     Marland Kitchen aspirin 81 MG tablet Take 81 mg by mouth daily.     Marland Kitchen buPROPion (WELLBUTRIN XL) 300 MG 24 hr tablet Take 300 mg by mouth daily.     Marland Kitchen esomeprazole (NEXIUM) 40 MG capsule Take 40 mg by mouth daily.     Marland Kitchen gabapentin (NEURONTIN) 300 MG capsule Take 300 mg by mouth 3 (three) times daily.     Marland Kitchen gabapentin (NEURONTIN) 800 MG tablet Take 800 mg by mouth 2 (two) times daily.     Marland Kitchen glucosamine-chondroitin 500-400 MG tablet Take 2 tablets by mouth daily.     Marland Kitchen lisinopril-hydrochlorothiazide (PRINZIDE,ZESTORETIC) 10-12.5 MG tablet Take 1 tablet by mouth daily.     . meloxicam (MOBIC) 7.5 MG tablet Take 7.5 mg by mouth in the morning and at bedtime.     Marland Kitchen OZEMPIC, 0.25 OR 0.5 MG/DOSE, 2 MG/1.5ML SOPN Inject 0.25 mg into the skin every Wednesday.     . Prenatal Vit-Fe Fumarate-FA (PRENATAL MULTIVITAMIN) TABS tablet Take 1 tablet by mouth daily at 12 noon.     Marland Kitchen testosterone cypionate (DEPOTESTOSTERONE CYPIONATE) 200 MG/ML injection Inject 100 mg into the muscle every 14 (fourteen) days.     Marland Kitchen vortioxetine HBr (TRINTELLIX) 20 MG TABS tablet Take 10 mg by mouth daily.      No Known Allergies  Social History   Tobacco Use  . Smoking status: Former Smoker  Packs/day: 4.00    Years: 15.00    Pack years: 60.00    Types: Cigarettes, Cigars    Quit date: 06/22/2002    Years since quitting: 18.2  . Smokeless tobacco: Never Used  . Tobacco comment: Currently smokes 2-3 cigars a year. FOR 8 YEARS PT SMOKED 3-4 PPD  Substance Use Topics  . Alcohol use: Yes    Comment: social    Family History  Problem Relation Age of Onset  . Diabetes Mother   . Lung cancer Paternal Grandmother   . Skin cancer Paternal Grandfather   . Allergies Other   . Cancer Other        great grandparents and aunts/uncles     Review of Systems  Musculoskeletal: Positive for gait problem.  All other  systems reviewed and are negative.   Objective:  Physical Exam Vitals reviewed.  Constitutional:      Appearance: Normal appearance.  HENT:     Head: Normocephalic and atraumatic.  Eyes:     Extraocular Movements: Extraocular movements intact.     Pupils: Pupils are equal, round, and reactive to light.  Cardiovascular:     Rate and Rhythm: Normal rate.     Pulses: Normal pulses.  Pulmonary:     Effort: Pulmonary effort is normal.  Abdominal:     Palpations: Abdomen is soft.  Musculoskeletal:     Cervical back: Normal range of motion.     Left hip: Tenderness and bony tenderness present. Decreased range of motion. Decreased strength.  Neurological:     Mental Status: He is alert and oriented to person, place, and time.  Psychiatric:        Behavior: Behavior normal.     Vital signs in last 24 hours:    Labs:   Estimated body mass index is 26.58 kg/m as calculated from the following:   Height as of 09/20/20: 6\' 2"  (1.88 m).   Weight as of 09/20/20: 93.9 kg.   Imaging Review Plain radiographs demonstrate severe degenerative joint disease of the left hip(s). The bone quality appears to be good for age and reported activity level.      Assessment/Plan:  End stage arthritis, left hip(s)  The patient history, physical examination, clinical judgement of the provider and imaging studies are consistent with end stage degenerative joint disease of the left hip(s) and total hip arthroplasty is deemed medically necessary. The treatment options including medical management, injection therapy, arthroscopy and arthroplasty were discussed at length. The risks and benefits of total hip arthroplasty were presented and reviewed. The risks due to aseptic loosening, infection, stiffness, dislocation/subluxation,  thromboembolic complications and other imponderables were discussed.  The patient acknowledged the explanation, agreed to proceed with the plan and consent was signed. Patient is  being admitted for inpatient treatment for surgery, pain control, PT, OT, prophylactic antibiotics, VTE prophylaxis, progressive ambulation and ADL's and discharge planning.The patient is planning to be discharged home with home health services

## 2020-09-27 ENCOUNTER — Ambulatory Visit (HOSPITAL_COMMUNITY): Payer: Federal, State, Local not specified - PPO | Admitting: Anesthesiology

## 2020-09-27 ENCOUNTER — Observation Stay (HOSPITAL_COMMUNITY)
Admission: RE | Admit: 2020-09-27 | Discharge: 2020-09-28 | Disposition: A | Discharge status: Home / Self Care | Payer: Federal, State, Local not specified - PPO | Source: Ambulatory Visit | Attending: Orthopaedic Surgery | Admitting: Orthopaedic Surgery

## 2020-09-27 ENCOUNTER — Observation Stay (HOSPITAL_COMMUNITY): Payer: Federal, State, Local not specified - PPO

## 2020-09-27 ENCOUNTER — Encounter (HOSPITAL_COMMUNITY): Payer: Self-pay | Admitting: Orthopaedic Surgery

## 2020-09-27 ENCOUNTER — Ambulatory Visit (HOSPITAL_COMMUNITY): Payer: Federal, State, Local not specified - PPO

## 2020-09-27 ENCOUNTER — Encounter (HOSPITAL_COMMUNITY)
Admission: RE | Disposition: A | Discharge status: Home / Self Care | Payer: Self-pay | Source: Ambulatory Visit | Attending: Orthopaedic Surgery

## 2020-09-27 ENCOUNTER — Other Ambulatory Visit: Payer: Self-pay

## 2020-09-27 DIAGNOSIS — R7303 Prediabetes: Secondary | ICD-10-CM | POA: Insufficient documentation

## 2020-09-27 DIAGNOSIS — Z79899 Other long term (current) drug therapy: Secondary | ICD-10-CM | POA: Insufficient documentation

## 2020-09-27 DIAGNOSIS — Z87891 Personal history of nicotine dependence: Secondary | ICD-10-CM | POA: Diagnosis not present

## 2020-09-27 DIAGNOSIS — I10 Essential (primary) hypertension: Secondary | ICD-10-CM | POA: Insufficient documentation

## 2020-09-27 DIAGNOSIS — M1612 Unilateral primary osteoarthritis, left hip: Principal | ICD-10-CM | POA: Insufficient documentation

## 2020-09-27 DIAGNOSIS — Z7982 Long term (current) use of aspirin: Secondary | ICD-10-CM | POA: Diagnosis not present

## 2020-09-27 DIAGNOSIS — Z96642 Presence of left artificial hip joint: Secondary | ICD-10-CM

## 2020-09-27 DIAGNOSIS — Z419 Encounter for procedure for purposes other than remedying health state, unspecified: Secondary | ICD-10-CM

## 2020-09-27 HISTORY — PX: TOTAL HIP ARTHROPLASTY: SHX124

## 2020-09-27 LAB — TYPE AND SCREEN
ABO/RH(D): B NEG
Antibody Screen: NEGATIVE

## 2020-09-27 LAB — ABO/RH: ABO/RH(D): B NEG

## 2020-09-27 SURGERY — ARTHROPLASTY, HIP, TOTAL, ANTERIOR APPROACH
Anesthesia: Monitor Anesthesia Care | Site: Hip | Laterality: Left

## 2020-09-27 MED ORDER — OXYCODONE HCL 5 MG PO TABS: 5.0000 mg | ORAL_TABLET | Freq: Once | ORAL | Status: DC | PRN

## 2020-09-27 MED ORDER — DEXAMETHASONE SODIUM PHOSPHATE 10 MG/ML IJ SOLN
INTRAMUSCULAR | Status: AC
  Filled 2020-09-27: qty 1

## 2020-09-27 MED ORDER — DIPHENHYDRAMINE HCL 12.5 MG/5ML PO ELIX: 12.5000 mg | ORAL_SOLUTION | ORAL | Status: DC | PRN

## 2020-09-27 MED ORDER — PROPOFOL 1000 MG/100ML IV EMUL
INTRAVENOUS | Status: AC
  Filled 2020-09-27: qty 100

## 2020-09-27 MED ORDER — LIDOCAINE 2% (20 MG/ML) 5 ML SYRINGE
INTRAMUSCULAR | Status: AC
  Filled 2020-09-27: qty 5

## 2020-09-27 MED ORDER — BUPIVACAINE IN DEXTROSE 0.75-8.25 % IT SOLN
INTRATHECAL | Status: DC | PRN
  Administered 2020-09-27: 2 mL via INTRATHECAL

## 2020-09-27 MED ORDER — METHOCARBAMOL 500 MG IVPB - SIMPLE MED
500.0000 mg | Freq: Four times a day (QID) | INTRAVENOUS | Status: DC | PRN
  Filled 2020-09-27: qty 50

## 2020-09-27 MED ORDER — METHOCARBAMOL 500 MG PO TABS
500.0000 mg | ORAL_TABLET | Freq: Four times a day (QID) | ORAL | Status: DC | PRN
  Administered 2020-09-27 – 2020-09-28 (×3): 500 mg via ORAL
  Filled 2020-09-27 (×3): qty 1

## 2020-09-27 MED ORDER — ONDANSETRON HCL 4 MG/2ML IJ SOLN
INTRAMUSCULAR | Status: DC | PRN
  Administered 2020-09-27: 4 mg via INTRAVENOUS

## 2020-09-27 MED ORDER — ACETAMINOPHEN 325 MG PO TABS
325.0000 mg | ORAL_TABLET | Freq: Four times a day (QID) | ORAL | Status: DC | PRN
  Administered 2020-09-28: 650 mg via ORAL
  Filled 2020-09-27: qty 2

## 2020-09-27 MED ORDER — SODIUM CHLORIDE 0.9 % IV SOLN: INTRAVENOUS | Status: DC

## 2020-09-27 MED ORDER — TRANEXAMIC ACID-NACL 1000-0.7 MG/100ML-% IV SOLN
1000.0000 mg | INTRAVENOUS | Status: AC
  Administered 2020-09-27 (×2): 1000 mg via INTRAVENOUS
  Filled 2020-09-27: qty 100

## 2020-09-27 MED ORDER — FENTANYL CITRATE (PF) 100 MCG/2ML IJ SOLN
INTRAMUSCULAR | Status: DC | PRN
  Administered 2020-09-27 (×2): 50 ug via INTRAVENOUS

## 2020-09-27 MED ORDER — DOCUSATE SODIUM 100 MG PO CAPS
100.0000 mg | ORAL_CAPSULE | Freq: Two times a day (BID) | ORAL | Status: DC
  Administered 2020-09-27 – 2020-09-28 (×3): 100 mg via ORAL
  Filled 2020-09-27 (×3): qty 1

## 2020-09-27 MED ORDER — FENTANYL CITRATE (PF) 100 MCG/2ML IJ SOLN
INTRAMUSCULAR | Status: AC
  Filled 2020-09-27: qty 2

## 2020-09-27 MED ORDER — POVIDONE-IODINE 10 % EX SWAB
2.0000 "application " | Freq: Once | CUTANEOUS | Status: AC
  Administered 2020-09-27: 2 via TOPICAL

## 2020-09-27 MED ORDER — LIDOCAINE HCL (CARDIAC) PF 100 MG/5ML IV SOSY
PREFILLED_SYRINGE | INTRAVENOUS | Status: DC | PRN
  Administered 2020-09-27: 50 mg via INTRAVENOUS

## 2020-09-27 MED ORDER — ALBUMIN HUMAN 5 % IV SOLN
INTRAVENOUS | Status: AC
  Filled 2020-09-27: qty 250

## 2020-09-27 MED ORDER — ALBUMIN HUMAN 5 % IV SOLN
12.5000 g | Freq: Once | INTRAVENOUS | Status: AC
  Administered 2020-09-27: 12.5 g via INTRAVENOUS

## 2020-09-27 MED ORDER — CHLORHEXIDINE GLUCONATE CLOTH 2 % EX PADS
6.0000 | MEDICATED_PAD | Freq: Every day | CUTANEOUS | Status: DC
  Administered 2020-09-28: 6 via TOPICAL

## 2020-09-27 MED ORDER — METOCLOPRAMIDE HCL 5 MG/ML IJ SOLN: 5.0000 mg | Freq: Three times a day (TID) | INTRAMUSCULAR | Status: DC | PRN

## 2020-09-27 MED ORDER — ONDANSETRON HCL 4 MG PO TABS: 4.0000 mg | ORAL_TABLET | Freq: Four times a day (QID) | ORAL | Status: DC | PRN

## 2020-09-27 MED ORDER — 0.9 % SODIUM CHLORIDE (POUR BTL) OPTIME
TOPICAL | Status: DC | PRN
  Administered 2020-09-27: 1000 mL

## 2020-09-27 MED ORDER — OXYCODONE HCL 5 MG/5ML PO SOLN
5.0000 mg | Freq: Once | ORAL | Status: DC | PRN
Start: 2020-09-27 — End: 2020-09-27

## 2020-09-27 MED ORDER — LISINOPRIL 10 MG PO TABS
10.0000 mg | ORAL_TABLET | Freq: Every day | ORAL | Status: DC
  Administered 2020-09-27: 10 mg via ORAL
  Filled 2020-09-27 (×2): qty 1

## 2020-09-27 MED ORDER — BUPROPION HCL ER (XL) 300 MG PO TB24
300.0000 mg | ORAL_TABLET | Freq: Every day | ORAL | Status: DC
  Administered 2020-09-28: 300 mg via ORAL
  Filled 2020-09-27: qty 1

## 2020-09-27 MED ORDER — LISINOPRIL-HYDROCHLOROTHIAZIDE 10-12.5 MG PO TABS: 1.0000 | ORAL_TABLET | Freq: Every day | ORAL | Status: DC

## 2020-09-27 MED ORDER — FENTANYL CITRATE (PF) 100 MCG/2ML IJ SOLN: 25.0000 ug | INTRAMUSCULAR | Status: DC | PRN

## 2020-09-27 MED ORDER — ONDANSETRON HCL 4 MG/2ML IJ SOLN
INTRAMUSCULAR | Status: AC
  Filled 2020-09-27: qty 2

## 2020-09-27 MED ORDER — ALUM & MAG HYDROXIDE-SIMETH 200-200-20 MG/5ML PO SUSP: 30.0000 mL | ORAL | Status: DC | PRN

## 2020-09-27 MED ORDER — ORAL CARE MOUTH RINSE: 15.0000 mL | Freq: Once | OROMUCOSAL | Status: AC

## 2020-09-27 MED ORDER — MIDAZOLAM HCL 5 MG/5ML IJ SOLN
INTRAMUSCULAR | Status: DC | PRN
  Administered 2020-09-27 (×2): 1 mg via INTRAVENOUS

## 2020-09-27 MED ORDER — ANASTROZOLE 1 MG PO TABS
1.0000 mg | ORAL_TABLET | Freq: Every day | ORAL | Status: DC
  Administered 2020-09-28: 1 mg via ORAL
  Filled 2020-09-27: qty 1

## 2020-09-27 MED ORDER — PROPOFOL 500 MG/50ML IV EMUL
INTRAVENOUS | Status: DC | PRN
  Administered 2020-09-27: 75 ug/kg/min via INTRAVENOUS

## 2020-09-27 MED ORDER — OXYCODONE HCL 5 MG PO TABS
5.0000 mg | ORAL_TABLET | ORAL | Status: DC | PRN
  Administered 2020-09-27 – 2020-09-28 (×3): 10 mg via ORAL
  Filled 2020-09-27 (×2): qty 2

## 2020-09-27 MED ORDER — STERILE WATER FOR IRRIGATION IR SOLN
Status: DC | PRN
  Administered 2020-09-27: 2000 mL

## 2020-09-27 MED ORDER — PHENYLEPHRINE 40 MCG/ML (10ML) SYRINGE FOR IV PUSH (FOR BLOOD PRESSURE SUPPORT)
PREFILLED_SYRINGE | INTRAVENOUS | Status: AC
  Filled 2020-09-27: qty 10

## 2020-09-27 MED ORDER — ALBUMIN HUMAN 5 % IV SOLN: INTRAVENOUS | Status: DC | PRN

## 2020-09-27 MED ORDER — ONDANSETRON HCL 4 MG/2ML IJ SOLN: 4.0000 mg | Freq: Four times a day (QID) | INTRAMUSCULAR | Status: DC | PRN

## 2020-09-27 MED ORDER — LACTATED RINGERS IV SOLN
INTRAVENOUS | Status: DC
  Administered 2020-09-27: 1000 mL via INTRAVENOUS

## 2020-09-27 MED ORDER — HYDROMORPHONE HCL 1 MG/ML IJ SOLN
0.5000 mg | INTRAMUSCULAR | Status: DC | PRN
  Administered 2020-09-27: 1 mg via INTRAVENOUS
  Filled 2020-09-27: qty 1

## 2020-09-27 MED ORDER — PHENYLEPHRINE HCL-NACL 10-0.9 MG/250ML-% IV SOLN
INTRAVENOUS | Status: DC | PRN
  Administered 2020-09-27: 25 ug/min via INTRAVENOUS

## 2020-09-27 MED ORDER — MENTHOL 3 MG MT LOZG: 1.0000 | LOZENGE | OROMUCOSAL | Status: DC | PRN

## 2020-09-27 MED ORDER — ASPIRIN 81 MG PO CHEW
81.0000 mg | CHEWABLE_TABLET | Freq: Two times a day (BID) | ORAL | Status: DC
  Administered 2020-09-27 – 2020-09-28 (×2): 81 mg via ORAL
  Filled 2020-09-27 (×2): qty 1

## 2020-09-27 MED ORDER — DEXAMETHASONE SODIUM PHOSPHATE 10 MG/ML IJ SOLN
INTRAMUSCULAR | Status: DC | PRN
  Administered 2020-09-27: 8 mg via INTRAVENOUS

## 2020-09-27 MED ORDER — PANTOPRAZOLE SODIUM 40 MG PO TBEC
40.0000 mg | DELAYED_RELEASE_TABLET | Freq: Every day | ORAL | Status: DC
  Administered 2020-09-27 – 2020-09-28 (×2): 40 mg via ORAL
  Filled 2020-09-27 (×2): qty 1

## 2020-09-27 MED ORDER — VORTIOXETINE HBR 5 MG PO TABS
10.0000 mg | ORAL_TABLET | Freq: Every day | ORAL | Status: DC
  Administered 2020-09-28: 10 mg via ORAL
  Filled 2020-09-27: qty 2

## 2020-09-27 MED ORDER — MIDAZOLAM HCL 2 MG/2ML IJ SOLN
INTRAMUSCULAR | Status: AC
  Filled 2020-09-27: qty 2

## 2020-09-27 MED ORDER — HYDROCHLOROTHIAZIDE 12.5 MG PO CAPS
12.5000 mg | ORAL_CAPSULE | Freq: Every day | ORAL | Status: DC
  Administered 2020-09-27: 12.5 mg via ORAL
  Filled 2020-09-27 (×2): qty 1

## 2020-09-27 MED ORDER — METOCLOPRAMIDE HCL 5 MG PO TABS: 5.0000 mg | ORAL_TABLET | Freq: Three times a day (TID) | ORAL | Status: DC | PRN

## 2020-09-27 MED ORDER — CHLORHEXIDINE GLUCONATE 0.12 % MT SOLN
15.0000 mL | Freq: Once | OROMUCOSAL | Status: AC
  Administered 2020-09-27: 15 mL via OROMUCOSAL

## 2020-09-27 MED ORDER — CEFAZOLIN SODIUM-DEXTROSE 1-4 GM/50ML-% IV SOLN
1.0000 g | Freq: Four times a day (QID) | INTRAVENOUS | Status: AC
Start: 2020-09-27 — End: 2020-09-28
  Administered 2020-09-27 (×2): 1 g via INTRAVENOUS
  Filled 2020-09-27 (×3): qty 50

## 2020-09-27 MED ORDER — ONDANSETRON HCL 4 MG/2ML IJ SOLN: 4.0000 mg | Freq: Once | INTRAMUSCULAR | Status: DC | PRN

## 2020-09-27 MED ORDER — OXYCODONE HCL 5 MG PO TABS
10.0000 mg | ORAL_TABLET | ORAL | Status: DC | PRN
  Administered 2020-09-28 (×2): 15 mg via ORAL
  Filled 2020-09-27: qty 2
  Filled 2020-09-27 (×2): qty 3

## 2020-09-27 MED ORDER — PHENYLEPHRINE HCL (PRESSORS) 10 MG/ML IV SOLN
INTRAVENOUS | Status: DC | PRN
  Administered 2020-09-27: 120 ug via INTRAVENOUS

## 2020-09-27 MED ORDER — GABAPENTIN 400 MG PO CAPS
800.0000 mg | ORAL_CAPSULE | Freq: Two times a day (BID) | ORAL | Status: DC
  Administered 2020-09-27 – 2020-09-28 (×3): 800 mg via ORAL
  Filled 2020-09-27 (×3): qty 2

## 2020-09-27 MED ORDER — SODIUM CHLORIDE 0.9 % IR SOLN
Status: DC | PRN
Start: 2020-09-27 — End: 2020-09-27
  Administered 2020-09-27: 1000 mL

## 2020-09-27 MED ORDER — PHENOL 1.4 % MT LIQD: 1.0000 | OROMUCOSAL | Status: DC | PRN

## 2020-09-27 SURGICAL SUPPLY — 39 items
APL SKNCLS STERI-STRIP NONHPOA (GAUZE/BANDAGES/DRESSINGS)
BAG SPEC THK2 15X12 ZIP CLS (MISCELLANEOUS)
BAG ZIPLOCK 12X15 (MISCELLANEOUS) IMPLANT
BENZOIN TINCTURE PRP APPL 2/3 (GAUZE/BANDAGES/DRESSINGS) IMPLANT
BLADE SAW SGTL 18X1.27X75 (BLADE) ×2 IMPLANT
COVER PERINEAL POST (MISCELLANEOUS) ×2 IMPLANT
COVER SURGICAL LIGHT HANDLE (MISCELLANEOUS) ×2 IMPLANT
COVER WAND RF STERILE (DRAPES) ×1 IMPLANT
CUP SECTOR GRIPTON 58MM (Orthopedic Implant) ×1 IMPLANT
DRAPE STERI IOBAN 125X83 (DRAPES) ×2 IMPLANT
DRAPE U-SHAPE 47X51 STRL (DRAPES) ×4 IMPLANT
DRSG AQUACEL AG ADV 3.5X10 (GAUZE/BANDAGES/DRESSINGS) ×2 IMPLANT
DURAPREP 26ML APPLICATOR (WOUND CARE) ×2 IMPLANT
ELECT REM PT RETURN 15FT ADLT (MISCELLANEOUS) ×2 IMPLANT
GAUZE XEROFORM 1X8 LF (GAUZE/BANDAGES/DRESSINGS) ×1 IMPLANT
GLOVE SRG 8 PF TXTR STRL LF DI (GLOVE) ×2 IMPLANT
GLOVE SURG ENC MOIS LTX SZ7.5 (GLOVE) ×2 IMPLANT
GLOVE SURG LTX SZ8 (GLOVE) ×2 IMPLANT
GLOVE SURG UNDER POLY LF SZ8 (GLOVE) ×4
GOWN STRL REUS W/TWL XL LVL3 (GOWN DISPOSABLE) ×4 IMPLANT
HANDPIECE INTERPULSE COAX TIP (DISPOSABLE) ×2
HEAD CERAMIC 36 PLUS5 (Hips) ×1 IMPLANT
HOLDER FOLEY CATH W/STRAP (MISCELLANEOUS) ×2 IMPLANT
KIT TURNOVER KIT A (KITS) ×2 IMPLANT
LINER NEUTRAL 36X58 PLUS4 ×1 IMPLANT
PACK ANTERIOR HIP CUSTOM (KITS) ×2 IMPLANT
PENCIL SMOKE EVACUATOR (MISCELLANEOUS) IMPLANT
SET HNDPC FAN SPRY TIP SCT (DISPOSABLE) ×1 IMPLANT
STAPLER VISISTAT 35W (STAPLE) ×1 IMPLANT
STEM AMT HIGH CORAIL SZ13X135 (Stem) ×1 IMPLANT
STRIP CLOSURE SKIN 1/2X4 (GAUZE/BANDAGES/DRESSINGS) IMPLANT
SUT ETHIBOND NAB CT1 #1 30IN (SUTURE) ×2 IMPLANT
SUT ETHILON 2 0 PS N (SUTURE) IMPLANT
SUT MNCRL AB 4-0 PS2 18 (SUTURE) IMPLANT
SUT VIC AB 0 CT1 36 (SUTURE) ×2 IMPLANT
SUT VIC AB 1 CT1 36 (SUTURE) ×2 IMPLANT
SUT VIC AB 2-0 CT1 27 (SUTURE) ×4
SUT VIC AB 2-0 CT1 TAPERPNT 27 (SUTURE) ×2 IMPLANT
TRAY FOLEY MTR SLVR 16FR STAT (SET/KITS/TRAYS/PACK) IMPLANT

## 2020-09-27 NOTE — Anesthesia Postprocedure Evaluation (Signed)
Anesthesia Post Note  Patient: George Hays  Procedure(s) Performed: LEFT TOTAL HIP ARTHROPLASTY ANTERIOR APPROACH (Left Hip)     Patient location during evaluation: PACU Anesthesia Type: MAC Level of consciousness: oriented and awake and alert Pain management: pain level controlled Vital Signs Assessment: post-procedure vital signs reviewed and stable Respiratory status: spontaneous breathing, respiratory function stable and patient connected to nasal cannula oxygen Cardiovascular status: blood pressure returned to baseline and stable Postop Assessment: no headache, no backache and no apparent nausea or vomiting Anesthetic complications: no   No complications documented.  Last Vitals:  Vitals:   09/27/20 1411 09/27/20 1602  BP: 101/63 110/83  Pulse: 67 80  Resp: 18   Temp: 36.6 C 36.7 C  SpO2: 100% 95%    Last Pain:  Vitals:   09/27/20 1602  TempSrc: Oral  PainSc:                  Cartier Mapel COKER

## 2020-09-27 NOTE — Evaluation (Signed)
Physical Therapy Evaluation Patient Details Name: George Hays MRN: [id number] DOB: 02-19-1966 Today's Date: 09/27/2020   History of Present Illness  Patient is 55 y.o. male s/p Lt THA anterior approach on 09/27/20 with PMH significant for HTN, GERD, OA, anemia.  Clinical Impression  THIJS BRUNTON is a 55 y.o. male POD 0 s/p Lt THA. Patient reports independent with mobility at baseline but has used [facility] for last couple weeks due to increased pain. Patient is now limited by functional impairments (see PT problem list below) and requires min assist/guard for transfers and gait with RW. Patient was able to ambulate ~50 feet with RW and min guard/assist. Patient instructed in exercise to facilitate  circulation to manage edema and rduce risk of DVT. Patient will benefit from continued skilled PT interventions to address impairments and progress towards PLOF. Acute PT will follow to progress mobility and stair training in preparation for safe discharge home.     Follow Up Recommendations Follow surgeon's recommendation for DC plan and follow-up therapies;Home health PT    Equipment Recommendations  None recommended by PT    Recommendations for Other Services       Precautions / Restrictions Precautions Precautions: Fall Restrictions Weight Bearing Restrictions: No      Mobility  Bed Mobility Overal bed mobility: Needs Assistance Bed Mobility: Supine to Sit     Supine to sit: Min assist;HOB elevated     General bed mobility comments: cues to use bed rail, assist for Lt LE to EOB. patient required extra time.    Transfers Overall transfer level: Needs assistance Equipment used: Rolling walker (2 wheeled) Transfers: Sit to/from Stand Sit to Stand: Min assist;From elevated surface         General transfer comment: VCs for safe hand placement, pt using bil UE for power up from elevated EOB. min assist to steady with rise.  Ambulation/Gait Ambulation/Gait assistance: Min  assist;Min guard Gait Distance (Feet): 50 Feet Assistive device: Rolling walker (2 wheeled) Gait Pattern/deviations: Step-to pattern;Step-through pattern;Decreased stride length;Decreased weight shift to left Gait velocity: decr   General Gait Details: cues for step to pattern and pt slowly progressed towards step through with no overt LOB. intermittent cues to keep walker in safe proximity.  Stairs            Wheelchair Mobility    Modified Rankin (Stroke Patients Only)       Balance                                             Pertinent Vitals/Pain Pain Assessment: 0-10 Pain Score: 4  Pain Location: Lt hip Pain Descriptors / Indicators: Burning Pain Intervention(s): Limited activity within patient's tolerance;Monitored during session;Repositioned;Premedicated before session;Ice applied    Home Living Family/patient expects to be discharged to:: Private residence Living Arrangements: Spouse/significant other Available Help at Discharge: Family Type of Home: House Home Access: Stairs to enter Entrance Stairs-Rails: Right Entrance Stairs-Number of Steps: 3 Home Layout: One level Home Equipment: Environmental consultant - 2 wheels;Cane - single point;Grab bars - tub/shower      Prior Function Level of Independence: Independent with assistive device(s);Independent         Comments: using SPC for last couple weeks due to more pain     Hand Dominance   Dominant Hand: Right    Extremity/Trunk Assessment   Upper Extremity Assessment Upper Extremity Assessment:  Overall [facility] for tasks assessed    Lower Extremity Assessment Lower Extremity Assessment: Overall WFL for tasks assessed    Cervical / Trunk Assessment Cervical / Trunk Assessment: Normal  Communication   Communication: No difficulties  Cognition Arousal/Alertness: Awake/alert Behavior During Therapy: WFL for tasks assessed/performed Overall Cognitive Status: Within Functional Limits for tasks  assessed                                        General Comments      Exercises Total Joint Exercises Ankle Circles/Pumps: AROM;Both;Seated;20 reps   Assessment/Plan    PT Assessment Patient needs continued PT services  PT Problem List Decreased strength;Decreased range of motion;Decreased activity tolerance;Decreased balance;Decreased mobility;Decreased knowledge of use of DME;Decreased knowledge of precautions;Pain       PT Treatment Interventions DME instruction;Gait training;Stair training;Functional mobility training;Therapeutic activities;Therapeutic exercise;Balance training;Patient/family education    PT Goals (Current goals can be found in the Care Plan section)  Acute Rehab PT Goals Patient Stated Goal: get back to fishing and exercising with wife PT Goal Formulation: With patient Time For Goal Achievement: 10/04/20 Potential to Achieve Goals: Good    Frequency 7X/week   Barriers to discharge        Co-evaluation               AM-PAC PT "6 Clicks" Mobility  Outcome Measure Help needed turning from your back to your side while in a flat bed without using bedrails?: A Little Help needed moving from lying on your back to sitting on the side of a flat bed without using bedrails?: A Little Help needed moving to and from a bed to a chair (including a wheelchair)?: A Little Help needed standing up from a chair using your arms (e.g., wheelchair or bedside chair)?: A Little Help needed to walk in hospital room?: A Little Help needed climbing 3-5 steps with a railing? : A Little 6 Click Score: 18    End of Session Equipment Utilized During Treatment: Gait belt Activity Tolerance: Patient tolerated treatment well Patient left: in chair;with call bell/phone within reach;with chair alarm set;with family/visitor present Nurse Communication: Mobility status PT Visit Diagnosis: Muscle weakness (generalized) (M62.81);Difficulty in walking, not  elsewhere classified (R26.2)    Time: 7412-8786 PT Time Calculation (min) (ACUTE ONLY): 33 min   Charges:   PT Evaluation $PT Eval Low Complexity: 1 Low PT Treatments $Gait Training: 8-22 mins        Verner Mould, DPT Acute Rehabilitation Services Office [phone] Pager [phone]    Jacques Navy 09/27/2020, 4:01 PM

## 2020-09-27 NOTE — Plan of Care (Signed)
  Problem: Education: Goal: Knowledge of the prescribed therapeutic regimen will improve Outcome: Progressing Goal: Understanding of discharge needs will improve Outcome: Progressing   Problem: Activity: Goal: Ability to avoid complications of mobility impairment will improve Outcome: Progressing Goal: Ability to tolerate increased activity will improve Outcome: Progressing   Problem: Pain Management: Goal: Pain level will decrease with appropriate interventions Outcome: Progressing   Problem: Nutrition: Goal: Adequate nutrition will be maintained Outcome: Progressing

## 2020-09-27 NOTE — Interval H&P Note (Signed)
History and Physical Interval Note: The patient understands he is here today for left total hip replacement to treat the pain from his left hip osteoarthritis.  There has been no acute or interval change in his medical status.  Please see recent H&P.  The risks and benefits of surgery have been explained in detail and informed consent was obtained.  The left hip has been marked.  09/27/2020 8:45 AM  George Hays  has presented today for surgery, with the diagnosis of osteoarthritis left hip.  The various methods of treatment have been discussed with the patient and family. After consideration of risks, benefits and other options for treatment, the patient has consented to  Procedure(s): LEFT TOTAL HIP ARTHROPLASTY ANTERIOR APPROACH (Left) as a surgical intervention.  The patient's history has been reviewed, patient examined, no change in status, stable for surgery.  I have reviewed the patient's chart and labs.  Questions were answered to the patient's satisfaction.     Mcarthur Rossetti

## 2020-09-27 NOTE — Brief Op Note (Signed)
09/27/2020  11:21 AM  PATIENT:  George Hays  55 y.o. male  PRE-OPERATIVE DIAGNOSIS:  osteoarthritis left hip  POST-OPERATIVE DIAGNOSIS:  osteoarthritis left hip  PROCEDURE:  Procedure(s): LEFT TOTAL HIP ARTHROPLASTY ANTERIOR APPROACH (Left)  SURGEON:  Surgeon(s) and Role:    Mcarthur Rossetti, MD - Primary  PHYSICIAN ASSISTANT:  Benita Stabile, PA-C  ANESTHESIA:   spinal  EBL:  250 mL   COUNTS:  YES  DICTATION: .Other Dictation: Dictation Number [id number]  PLAN OF CARE: Admit for overnight observation  PATIENT DISPOSITION:  PACU - hemodynamically stable.   Delay start of Pharmacological VTE agent (>24hrs) due to surgical blood loss or risk of bleeding: no

## 2020-09-27 NOTE — Anesthesia Preprocedure Evaluation (Addendum)
Anesthesia Evaluation  Patient identified by MRN, date of birth, ID band Patient awake    Reviewed: Allergy & Precautions, NPO status , Patient's Chart, lab work & pertinent test results  Airway Mallampati: III  TM Distance: >3 FB Neck ROM: Full    Dental  (+) Teeth Intact, Dental Advisory Given   Pulmonary former smoker,    breath sounds clear to auscultation       Cardiovascular hypertension,  Rhythm:Regular Rate:Normal     Neuro/Psych    GI/Hepatic   Endo/Other    Renal/GU      Musculoskeletal   Abdominal (+) + obese,   Peds  Hematology   Anesthesia Other Findings   Reproductive/Obstetrics                            Anesthesia Physical Anesthesia Plan  ASA: III  Anesthesia Plan: MAC and Spinal   Post-op Pain Management:    Induction:   PONV Risk Score and Plan: Ondansetron and Dexamethasone  Airway Management Planned: Natural Airway and Simple Face Mask  Additional Equipment:   Intra-op Plan:   Post-operative Plan:   Informed Consent: I have reviewed the patients History and Physical, chart, labs and discussed the procedure including the risks, benefits and alternatives for the proposed anesthesia with the patient or authorized representative who has indicated his/her understanding and acceptance.     Dental advisory given  Plan Discussed with: CRNA and Anesthesiologist  Anesthesia Plan Comments:         Anesthesia Quick Evaluation

## 2020-09-27 NOTE — Transfer of Care (Signed)
Immediate Anesthesia Transfer of Care Note  Patient: George Hays  Procedure(s) Performed: Procedure(s): LEFT TOTAL HIP ARTHROPLASTY ANTERIOR APPROACH (Left)  Patient Location: PACU  Anesthesia Type:Spinal  Level of Consciousness:  sedated, patient cooperative and responds to stimulation  Airway & Oxygen Therapy:Patient Spontanous Breathing and Patient connected to face mask oxgen  Post-op Assessment:  Report given to PACU RN and Post -op Vital signs reviewed and stable  Post vital signs:  Reviewed and stable  Last Vitals:  Vitals:   09/27/20 0737  BP: (!) 145/88  Pulse: 82  Resp: 18  Temp: 36.5 C  SpO2: 24%    Complications: No apparent anesthesia complications

## 2020-09-27 NOTE — Progress Notes (Signed)
Pt has declined use of CPAP tonight per wife.

## 2020-09-27 NOTE — Anesthesia Procedure Notes (Signed)
Spinal  Patient location during procedure: OR Reason for block: surgical anesthesia Staffing Performed: resident/CRNA  Resident/CRNA: Garrel Ridgel, CRNA Preanesthetic Checklist Completed: patient identified, IV checked, site marked, risks and benefits discussed, surgical consent, monitors and equipment checked, pre-op evaluation and timeout performed Spinal Block Patient position: sitting Prep: ChloraPrep Patient monitoring: heart rate, cardiac monitor, continuous pulse ox and blood pressure Approach: midline Location: L4-5 Injection technique: single-shot Needle Needle type: Sprotte and Pencan  Needle gauge: 24 G Needle length: 9 cm Needle insertion depth: 6 cm Assessment Sensory level: T4 Events: CSF return

## 2020-09-28 ENCOUNTER — Other Ambulatory Visit: Payer: Self-pay | Admitting: Orthopaedic Surgery

## 2020-09-28 DIAGNOSIS — M1612 Unilateral primary osteoarthritis, left hip: Secondary | ICD-10-CM | POA: Diagnosis not present

## 2020-09-28 LAB — CBC
HCT: 33.4 % — ABNORMAL LOW (ref 39.0–52.0)
Hemoglobin: 11 g/dL — ABNORMAL LOW (ref 13.0–17.0)
MCH: 30.1 pg (ref 26.0–34.0)
MCHC: 32.9 g/dL (ref 30.0–36.0)
MCV: 91.5 fL (ref 80.0–100.0)
Platelets: 301 10*3/uL (ref 150–400)
RBC: 3.65 MIL/uL — ABNORMAL LOW (ref 4.22–5.81)
RDW: 12.3 % (ref 11.5–15.5)
WBC: 12.2 10*3/uL — ABNORMAL HIGH (ref 4.0–10.5)
nRBC: 0 % (ref 0.0–0.2)

## 2020-09-28 LAB — BASIC METABOLIC PANEL
Anion gap: 8 (ref 5–15)
BUN: 12 mg/dL (ref 6–20)
CO2: 30 mmol/L (ref 22–32)
Calcium: 8.2 mg/dL — ABNORMAL LOW (ref 8.9–10.3)
Chloride: 97 mmol/L — ABNORMAL LOW (ref 98–111)
Creatinine, Ser: 1.06 mg/dL (ref 0.61–1.24)
GFR, Estimated: >60 mL/min (ref >60)
Glucose, Bld: 178 mg/dL — ABNORMAL HIGH (ref 70–99)
Potassium: 3.5 mmol/L (ref 3.5–5.1)
Sodium: 135 mmol/L (ref 135–145)

## 2020-09-28 MED ORDER — OXYCODONE HCL 5 MG PO TABS: 5.0000 mg | ORAL_TABLET | ORAL | 0 refills | Status: DC | PRN

## 2020-09-28 MED ORDER — ASPIRIN 81 MG PO CHEW: 81.0000 mg | CHEWABLE_TABLET | Freq: Two times a day (BID) | ORAL | 0 refills | Status: DC

## 2020-09-28 MED ORDER — METHOCARBAMOL 500 MG PO TABS
500.0000 mg | ORAL_TABLET | Freq: Four times a day (QID) | ORAL | 1 refills | Status: DC | PRN
Start: 2020-09-28 — End: 2020-09-28

## 2020-09-28 MED ORDER — ASPIRIN 81 MG PO CHEW: 81.0000 mg | CHEWABLE_TABLET | Freq: Two times a day (BID) | ORAL | 0 refills | Status: AC

## 2020-09-28 MED ORDER — METHOCARBAMOL 500 MG PO TABS: 500.0000 mg | ORAL_TABLET | Freq: Four times a day (QID) | ORAL | 1 refills | Status: DC | PRN

## 2020-09-28 NOTE — Discharge Instructions (Signed)

## 2020-09-28 NOTE — Discharge Summary (Signed)
Patient ID: George Hays MRN: [id number] DOB/AGE: May 04, 1966 55 y.o.  Admit date: 09/27/2020 Discharge date: 09/28/2020  Admission Diagnoses:  Principal Problem:   Unilateral primary osteoarthritis, left hip Active Problems:   Status post left hip replacement   Discharge Diagnoses:  Same  Past Medical History:  Diagnosis Date  . Anemia   . Arthritis   . GERD (gastroesophageal reflux disease)    chronic indigestion  . Hypertension   . Pre-diabetes     Surgeries: Procedure(s): LEFT TOTAL HIP ARTHROPLASTY ANTERIOR APPROACH on 09/27/2020   Consultants:   Discharged Condition: Improved  Hospital Course: George Hays is an 55 y.o. male who was admitted 09/27/2020 for operative treatment ofUnilateral primary osteoarthritis, left hip. Patient has severe unremitting pain that affects sleep, daily activities, and work/hobbies. After pre-op clearance the patient was taken to the operating room on 09/27/2020 and underwent  Procedure(s): LEFT TOTAL HIP ARTHROPLASTY ANTERIOR APPROACH.    Patient was given perioperative antibiotics:  Anti-infectives (From admission, onward)   Start     Dose/Rate Route Frequency Ordered Stop   09/27/20 1600  ceFAZolin (ANCEF) IVPB 1 g/50 mL premix        1 g 100 mL/hr over 30 Minutes Intravenous Every 6 hours 09/27/20 1419 09/28/20 0756   09/27/20 0600  ceFAZolin (ANCEF) 3 g in dextrose 5 % 50 mL IVPB        3 g 100 mL/hr over 30 Minutes Intravenous On call to O.R. 09/26/20 0720 09/27/20 1018       Patient was given sequential compression devices, early ambulation, and chemoprophylaxis to prevent DVT.  Patient benefited maximally from hospital stay and there were no complications.    Recent vital signs:  Patient Vitals for the past 24 hrs:  BP Temp Temp src Pulse Resp SpO2  09/28/20 1013 110/69 98.4 F (36.9 C) -- 86 16 97 %  09/28/20 0515 (!) 100/55 98.9 F (37.2 C) -- 93 20 98 %  09/28/20 0132 111/66 98.4 F (36.9 C) -- 93 17 96 %  09/27/20  2019 119/67 98.4 F (36.9 C) Oral (!) 104 18 95 %  09/27/20 1602 110/83 98 F (36.7 C) Oral 80 -- 95 %  09/27/20 1411 101/63 97.8 F (36.6 C) Oral 67 18 100 %  09/27/20 1345 96/77 (!) 97.4 F (36.3 C) -- 70 19 --  09/27/20 1330 101/68 -- -- 63 17 100 %     Recent laboratory studies:  Recent Labs    09/28/20 0305  WBC 12.2*  HGB 11.0*  HCT 33.4*  PLT 301  NA 135  K 3.5  CL 97*  CO2 30  BUN 12  CREATININE 1.06  GLUCOSE 178*  CALCIUM 8.2*     Discharge Medications:   Allergies as of 09/28/2020   No Known Allergies     Medication List    STOP taking these medications   aspirin 81 MG tablet Replaced by: aspirin 81 MG chewable tablet     TAKE these medications   anastrozole 1 MG tablet Commonly known as: ARIMIDEX Take 1 mg by mouth daily.   aspirin 81 MG chewable tablet Chew 1 tablet (81 mg total) by mouth 2 (two) times daily. Replaces: aspirin 81 MG tablet   buPROPion 300 MG 24 hr tablet Commonly known as: WELLBUTRIN XL Take 300 mg by mouth daily.   esomeprazole 40 MG capsule Commonly known as: NEXIUM Take 40 mg by mouth daily.   gabapentin 800 MG tablet Commonly known as: NEURONTIN  Take 800 mg by mouth 2 (two) times daily.   glucosamine-chondroitin 500-400 MG tablet Take 2 tablets by mouth daily.   lisinopril-hydrochlorothiazide 10-12.5 MG tablet Commonly known as: ZESTORETIC Take 1 tablet by mouth daily.   meloxicam 7.5 MG tablet Commonly known as: MOBIC Take 7.5 mg by mouth in the morning and at bedtime.   methocarbamol 500 MG tablet Commonly known as: ROBAXIN Take 1 tablet (500 mg total) by mouth every 6 (six) hours as needed for muscle spasms.   oxyCODONE 5 MG immediate release tablet Commonly known as: Oxy IR/ROXICODONE Take 1-2 tablets (5-10 mg total) by mouth every 4 (four) hours as needed for moderate pain (pain score 4-6).   Ozempic (0.25 or 0.5 MG/DOSE) 2 MG/1.5ML Sopn Generic drug: Semaglutide(0.25 or 0.5MG /DOS) Inject 0.25 mg  into the skin every Wednesday.   prenatal multivitamin Tabs tablet Take 1 tablet by mouth daily at 12 noon.   testosterone cypionate 200 MG/ML injection Commonly known as: DEPOTESTOSTERONE CYPIONATE Inject 100 mg into the muscle every 14 (fourteen) days.   vortioxetine HBr 20 MG Tabs tablet Commonly known as: TRINTELLIX Take 10 mg by mouth daily.            Durable Medical Equipment  (From admission, onward)         Start     Ordered   09/27/20 1420  DME 3 n 1  Once        09/27/20 1419   09/27/20 1420  DME Walker rolling  Once       Question Answer Comment  Walker: With 5 Inch Wheels   Patient needs a walker to treat with the following condition Status post left hip replacement      09/27/20 1419          Diagnostic Studies: DG Pelvis Portable  Result Date: 09/27/2020 CLINICAL DATA:  Status post left hip replacement. EXAM: PORTABLE PELVIS 1-2 VIEWS COMPARISON:  None. FINDINGS: The left acetabular and femoral components are well situated. Expected postoperative changes are seen in the surrounding soft tissues. IMPRESSION: Status post left total hip arthroplasty. Electronically Signed   By: Marijo Conception M.D.   On: 09/27/2020 13:56   DG C-Arm 1-60 Min-No Report  Result Date: 09/27/2020 Fluoroscopy was utilized by the requesting physician.  No radiographic interpretation.   DG HIP OPERATIVE UNILAT W OR W/O PELVIS LEFT  Result Date: 09/27/2020 CLINICAL DATA:  Left hip replacement. EXAM: OPERATIVE left HIP (WITH PELVIS IF PERFORMED) 5 VIEWS TECHNIQUE: Fluoroscopic spot image(s) were submitted for interpretation post-operatively. Radiation exposure index: 4.8278 mGy. COMPARISON:  July 18, 2020. FINDINGS: Five intraoperative fluoroscopic images were obtained of the left hip. The left acetabular and femoral components are well situated. IMPRESSION: Fluoroscopic guidance provided during left total hip arthroplasty. Electronically Signed   By: Marijo Conception M.D.   On:  09/27/2020 12:52    Disposition: Discharge disposition: 01-Home or [place]    Mcarthur Rossetti, MD Follow up in 2 week(s).   Specialty: Orthopedic Surgery Contact information: [place] [address] [place], [place] Follow up.   Specialty: [place] Why: agency will provide home health therapy Contact information: [place] [address] [phone]                Signed: Mcarthur Rossetti 09/28/2020, 1:16 PM

## 2020-09-28 NOTE — Progress Notes (Signed)
Physical Therapy Treatment Patient Details Name: George Hays MRN: [id number] DOB: 1965/10/12 Today's Date: 09/28/2020    History of Present Illness Patient is 55 y.o. male s/p Lt THA anterior approach on 09/27/20 with PMH significant for HTN, GERD, OA, anemia.    PT Comments    Pt continues very cooperative and progressing steadily with mobility but requiring increased time for all tasks 2* c/o L hip/thigh pain/burning.  Pt assisted to/from bed, up to bathroom for toileting and hygiene, ambulated limited distance in hall to negotiate stairs with cane and rail, and reviewed car transfers.  Pt's spouse present for family ed.   Follow Up Recommendations  Follow surgeon's recommendation for DC plan and follow-up therapies;Home health PT     Equipment Recommendations  None recommended by PT    Recommendations for Other Services       Precautions / Restrictions Precautions Precautions: Fall Restrictions Weight Bearing Restrictions: No LLE Weight Bearing: Weight bearing as tolerated    Mobility  Bed Mobility Overal bed mobility: Needs Assistance Bed Mobility: Supine to Sit;Sit to Supine     Supine to sit: Min assist Sit to supine: Min guard   General bed mobility comments: cues for sequence and use of gait belt and R LE to self assist.    Transfers Overall transfer level: Needs assistance Equipment used: Rolling walker (2 wheeled) Transfers: Sit to/from Stand Sit to Stand: Min guard;Supervision         General transfer comment: cues for LE management and use of UEs to self assist  Ambulation/Gait Ambulation/Gait assistance: Min guard;Supervision Gait Distance (Feet): 80 Feet Assistive device: Rolling walker (2 wheeled) Gait Pattern/deviations: Step-to pattern;Step-through pattern;Decreased stride length;Decreased weight shift to left Gait velocity: decr   General Gait Details: cues for posture, position from RW and intial sequence   Stairs Stairs: Yes Stairs  assistance: Min assist Stair Management: One rail Right;Step to pattern;With cane;Forwards Number of Stairs: 3 General stair comments: cues for sequence and foot/cane placement   Wheelchair Mobility    Modified Rankin (Stroke Patients Only)       Balance Overall balance assessment: Needs assistance Sitting-balance support: No upper extremity supported;Feet supported Sitting balance-Leahy Scale: Good     Standing balance support: No upper extremity supported Standing balance-Leahy Scale: Fair                              Cognition Arousal/Alertness: Awake/alert Behavior During Therapy: WFL for tasks assessed/performed Overall Cognitive Status: Within Functional Limits for tasks assessed                                        Exercises Total Joint Exercises Ankle Circles/Pumps: AROM;Both;Seated;20 reps    General Comments        Pertinent Vitals/Pain Pain Assessment: 0-10 Pain Score: 6  Pain Location: Lt hip Pain Descriptors / Indicators: Aching;Burning;Sore Pain Intervention(s): Limited activity within patient's tolerance;Monitored during session;Premedicated before session;Ice applied    Home Living                      Prior Function            PT Goals (current goals can now be found in the care plan section) Acute Rehab PT Goals Patient Stated Goal: get back to fishing and exercising with wife PT Goal Formulation: With patient  Time For Goal Achievement: 10/04/20 Potential to Achieve Goals: Good Progress towards PT goals: Progressing toward goals    Frequency    7X/week      PT Plan Current plan remains appropriate    Co-evaluation              AM-PAC PT "6 Clicks" Mobility   Outcome Measure  Help needed turning from your back to your side while in a flat bed without using bedrails?: A Little Help needed moving from lying on your back to sitting on the side of a flat bed without using bedrails?: A  Little Help needed moving to and from a bed to a chair (including a wheelchair)?: A Little Help needed standing up from a chair using your arms (e.g., wheelchair or bedside chair)?: A Little Help needed to walk in hospital room?: A Little Help needed climbing 3-5 steps with a railing? : A Little 6 Click Score: 18    End of Session Equipment Utilized During Treatment: Gait belt Activity Tolerance: Patient limited by pain Patient left: in bed;with call bell/phone within reach;with family/visitor present Nurse Communication: Mobility status;Patient requests pain meds PT Visit Diagnosis: Muscle weakness (generalized) (M62.81);Difficulty in walking, not elsewhere classified (R26.2)     Time: 2549-8264 PT Time Calculation (min) (ACUTE ONLY): 39 min  Charges:  $Gait Training: 23-37 mins $Therapeutic Activity: 8-22 mins                     [place] Pager [phone] Office [phone]    Malania Gawthrop 09/28/2020, 4:35 PM

## 2020-09-28 NOTE — Plan of Care (Signed)
Instructions were reviewed with patient. All questions were answered. Patient was transported to main entrance by wheelchair. ° °

## 2020-09-28 NOTE — Op Note (Signed)
NAME: George Hays, George Hays MEDICAL RECORD NO: [id number] ACCOUNT NO: [id number] DATE OF BIRTH: 12-26-1965 FACILITY: [facility]ATION: [facility] PHYSICIAN: Lind Guest. Ninfa Linden, MD  Operative Report   DATE OF PROCEDURE: 09/27/2020   PREOPERATIVE DIAGNOSIS:  Primary osteoarthritis and degenerative joint disease, left hip.  POSTOPERATIVE DIAGNOSIS:  Primary osteoarthritis and degenerative joint disease, left hip.  PROCEDURE:  Left total hip arthroplasty through direct anterior approach.  IMPLANTS:  DePuy sector Gription acetabular component, size 58, size 36+4 neutral polyethylene liner, size 13 Corail femoral component with high offset, size 36+5 ceramic hip ball.  SURGEON:  Jean Rosenthal, M.D.   ASSISTANT:  Erskine Emery, PA-C.  ANESTHESIA:  Spinal.  ANTIBIOTICS:  2 g IV Ancef.  ESTIMATED BLOOD LOSS:  700 mL  COMPLICATIONS:  None.  INDICATIONS:  The patient is a 55 year old gentleman who has debilitating arthritis involving his left hip. At this point, it is detrimentally affecting his mobility, his quality of life and his activities of daily living.  He is a large, morbidly obese  individual.  He has lost a significant amount of weight and I do feel that he will continue on his weight loss.  He is wondering if we were able to replace his hip and take care of that daily pain that he is experiencing.  We hope with the goals of the  surgery we will increase his mobility, his quality of life and his activities of daily living.  He understands with the surgery given his weight there is heightened risk of acute blood loss anemia, nerve or vessel injury, fracture, infection,  dislocation, DVT, implant failure and skin and soft tissue issues.  DESCRIPTION OF PROCEDURE:  After informed consent was obtained, appropriate left hip was marked, he was brought to the operating room and sat up on a stretcher.  Spinal anesthesia was obtained, he was then laid in supine position on the stretcher.   Foley  catheter was placed.  I assessed his leg length and he is definitely short on the left than the right and he really likes to lay in an externally rotated position on the left hip.  Traction boots were placed on both his feet.  Next, I placed him supine  on the Hana fracture table, the perineal post in place and both legs in line skeletal traction devices.  No traction applied.  His left operative hip was prepped and draped with DuraPrep and sterile drapes.  A timeout was called and he was identified  correct patient, correct left hip.  I then made an incision just inferior and posterior to the anterior iliac spine and carried this obliquely down the leg.  We dissected down tensor fascia lata muscle.  Tensor fascia was then divided longitudinally to  proceed with direct anterior approach to the hip.  We identified and cauterized circumflex vessels and identified the hip capsule, opened the hip capsule in L-type format finding moderate joint effusion and significant periarticular osteophytes around  the femoral head and neck.  We placed Cobra retractors within the joint capsule around the medial and lateral femoral neck and made our femoral neck cut with oscillating saw just proximal to the lesser trochanter and completed this with an osteotome  placed a corkscrew guide in the femoral head and removed the femoral head its entirety and found a wide area devoid of cartilage.  It was a very large femoral head.  We then removed from its acetabular labrum and other debris from the acetabulum and  placed a bent Hohmann  over the medial acetabular rim and then began reaming under direct visualization from a size 43 reamer going all the way up to size 57 with all reamers placed under direct visualization.  Last reamer placed under direct fluoroscopy  so we could obtain my depth of reaming by inclination and anteversion.  I then placed a real DePuy sector Gription acetabular component, size 58 and a 36+4 neutral  polyethylene liner for that size acetabular component.  Attention was then turned to the  femur with the leg externally rotated to 120 degrees and extended and adducted, we are able to place a Mueller retractor medially and Hohman retractor behind the greater trochanter, released lateral joint capsule and used a box cutting osteotome to enter  femoral canal and a rongeur to lateralize, we then began broaching using the carotid broaching system from a size 8 going up to a size 12.  With a size 12 in place, we trialed a standard offset femoral neck and a 36+15 hip ball, reduced this in  acetabulum and it was quite short and unstable and needed more offset.  We dislocated the hip and removed the trial components.  I felt that the trial 12 stem was still a little loose, so we have trialed up to a size 13 stem.  We were pleased with this  fit of the 13 stem, so removed the trial 13 broach and went with a real size 13 femoral component with high offset.  It had a nice bite to that and actually was proud above the calcar, but we did have a short neck cut.  We decided at that point to go  with a 36+5 hip ball and knowing that we had the prouder stem and more offset and needing still some leg length.  We placed a 36+5 ceramic hip ball and reduced this in acetabulum.  He was very tight and stable with good range of motion assessed  mechanically and clinically.  We felt good about his offset and leg lengths as well.  We then irrigated the soft tissue with normal saline solution using pulsatile lavage.  We reapproximated the joint capsule with interrupted #1 Ethibond suture followed  by #1 Vicryl to close the tensor fascia, 0 Vicryl in the deep tissue and 2-0 Vicryl to close subcutaneous tissue.  The skin was closed with staples.  An Aquacel dressing was applied.  He was taken off the Hana table and taken to recovery room in stable  condition with all final counts being correct.  No complications noted.  Of note, Benita Stabile, PA-C assisted during the entire case and his assistance was crucial for facilitating every aspect of this case.   Elián.Darby D: 09/27/2020 11:19:17 am T: 09/28/2020 2:11:00 am  JOB: [id number]/ [id number]

## 2020-09-28 NOTE — Progress Notes (Signed)
Subjective: 1 Day Post-Op Procedure(s) (LRB): LEFT TOTAL HIP ARTHROPLASTY ANTERIOR APPROACH (Left) Patient reports pain as moderate.    Objective: Vital signs in last 24 hours: Temp:  [97.4 F (36.3 C)-98.9 F (37.2 C)] 98.4 F (36.9 C) (04/09 1013) Pulse Rate:  [63-104] 86 (04/09 1013) Resp:  [14-20] 16 (04/09 1013) BP: (96-119)/(55-83) 110/69 (04/09 1013) SpO2:  [95 %-100 %] 97 % (04/09 1013)  Intake/Output from previous day: 04/08 0701 - 04/09 0700 In: 4672.1 [P.O.:1300; I.V.:3072.1; IV Piggyback:300] Out: 5300 [Urine:5050; Blood:250] Intake/Output this shift: Total I/O In: 240 [P.O.:240] Out: 300 [Urine:300]  Recent Labs    09/28/20 0305  HGB 11.0*   Recent Labs    09/28/20 0305  WBC 12.2*  RBC 3.65*  HCT 33.4*  PLT 301   Recent Labs    09/28/20 0305  NA 135  K 3.5  CL 97*  CO2 30  BUN 12  CREATININE 1.06  GLUCOSE 178*  CALCIUM 8.2*   No results for input(s): LABPT, INR in the last 72 hours.  Sensation intact distally Intact pulses distally Dorsiflexion/Plantar flexion intact Incision: scant drainage   Assessment/Plan: 1 Day Post-Op Procedure(s) (LRB): LEFT TOTAL HIP ARTHROPLASTY ANTERIOR APPROACH (Left) Up with therapy Discharge home with home health      George Hays 09/28/2020, 1:13 PM

## 2020-09-28 NOTE — TOC Initial Note (Signed)
Transition of Care [facility]) - Initial/Assessment Note    Patient Details  Name: George Hays MRN: [id number] Date of Birth: 13-Nov-1965  Transition of Care (TOC) CM/SW Contact:    Joaquin Courts, RN Phone Number: 09/28/2020, 10:45 AM  Clinical Narrative:                 CM spoke with patient, [facility] to provide HHPT.  Patient reports has rolling walker and 3in1.  Expected Discharge Plan: [place] Barriers to Discharge: No Barriers Identified   Patient Goals and CMS Choice Patient states their goals for this hospitalization and ongoing recovery are:: to go home with therapy CMS Medicare.gov Compare Post Acute Care list provided to:: Patient Choice offered to / list presented to : Patient  Expected Discharge Plan and Services Expected Discharge Plan: [place]   Discharge Planning Services: CM Consult Post Acute Care Choice: [place] arrangements for the past 2 months: Single Family Home                 DME Arranged: N/A DME Agency: NA       HH Arranged: PT HH Agency: [facility] [employer]     Representative spoke with at [facility]: pre-arranged in md office  Prior Living Arrangements/Services Living arrangements for the past 2 months: [place]   Patient language and need for interpreter reviewed:: Yes Do you feel safe going back to the place where you live?: Yes      Need for Family Participation in Patient Care: Yes (Comment) Care giver support system in place?: Yes (comment)   Criminal Activity/Legal Involvement Pertinent to Current Situation/Hospitalization: No - Comment as needed  Activities of Daily Living Home Assistive Devices/Equipment: Cane (specify quad or straight),Eyeglasses ADL Screening (condition at time of admission) Patient's cognitive ability adequate to safely complete daily activities?: Yes Is the patient deaf or have difficulty hearing?: No Does the  patient have difficulty seeing, even when wearing glasses/contacts?: No Does the patient have difficulty concentrating, remembering, or making decisions?: No Patient able to express need for assistance with ADLs?: Yes Does the patient have difficulty dressing or bathing?: No Independently performs ADLs?: Yes (appropriate for developmental age) Does the patient have difficulty walking or climbing stairs?: No Weakness of Legs: None Weakness of Arms/Hands: None  Permission Sought/Granted                  Emotional Assessment Appearance:: Appears stated age Attitude/Demeanor/Rapport: Engaged Affect (typically observed): Accepting Orientation: : Oriented to Self,Oriented to Place,Oriented to  Time,Oriented to Situation   Psych Involvement: No (comment)  Admission diagnosis:  Status post left hip replacement [W09.811] Patient Active Problem List   Diagnosis Date Noted  . Status post left hip replacement 09/27/2020  . Unilateral primary osteoarthritis, left hip 07/06/2019  . Obesity 05/08/2014  . DOE (dyspnea on exertion) 03/10/2013  . OSA (obstructive sleep apnea) 03/10/2013   PCP:  Hayden Rasmussen, MD Pharmacy:   [facility] [place] [address] Phone: [phone] Fax: [phone]     Social Determinants of Health (SDOH) Interventions    Readmission Risk Interventions No flowsheet data found.

## 2020-09-28 NOTE — Progress Notes (Signed)
Physical Therapy Treatment Patient Details Name: George Hays MRN: [id number] DOB: October 04, 1965 Today's Date: 09/28/2020    History of Present Illness Patient is 55 y.o. male s/p Lt THA anterior approach on 09/27/20 with PMH significant for HTN, GERD, OA, anemia.    PT Comments    Pt assisted from bed to ambulate to bathroom for toileting and hygiene at sink.  On exiting bathroom, pt reports pain increased to 7-8/10 "I think I put more weight on it".  Pt completed ambulation to sit up in chair, RN notified and ice packs provided.  Reviewed car transfers with pt while seated.    Follow Up Recommendations  Follow surgeon's recommendation for DC plan and follow-up therapies;Home health PT     Equipment Recommendations  None recommended by PT    Recommendations for Other Services       Precautions / Restrictions Precautions Precautions: Fall Restrictions Weight Bearing Restrictions: No LLE Weight Bearing: Weight bearing as tolerated    Mobility  Bed Mobility Overal bed mobility: Needs Assistance Bed Mobility: Supine to Sit     Supine to sit: Min assist     General bed mobility comments: cues for sequence and use of R LE to self assist.  Physical assist to manage L LE    Transfers Overall transfer level: Needs assistance Equipment used: Rolling walker (2 wheeled) Transfers: Sit to/from Stand Sit to Stand: Min assist         General transfer comment: cues for LE management and use of UEs to self assist  Ambulation/Gait Ambulation/Gait assistance: Min guard Gait Distance (Feet): 15 Feet (15' twice - to/from bathroom) Assistive device: Rolling walker (2 wheeled) Gait Pattern/deviations: Step-to pattern;Step-through pattern;Decreased stride length;Decreased weight shift to left Gait velocity: decr   General Gait Details: cues for posture, position from RW and intial sequence   Stairs             Wheelchair Mobility    Modified Rankin (Stroke Patients  Only)       Balance                                            Cognition Arousal/Alertness: Awake/alert Behavior During Therapy: WFL for tasks assessed/performed Overall Cognitive Status: Within Functional Limits for tasks assessed                                        Exercises      General Comments        Pertinent Vitals/Pain Pain Assessment: 0-10 Pain Score: 8  Pain Location: Lt hip Pain Descriptors / Indicators: Aching;Burning;Sore Pain Intervention(s): Limited activity within patient's tolerance;Monitored during session;Premedicated before session;Ice applied    Home Living                      Prior Function            PT Goals (current goals can now be found in the care plan section) Acute Rehab PT Goals Patient Stated Goal: get back to fishing and exercising with wife PT Goal Formulation: With patient Time For Goal Achievement: 10/04/20 Potential to Achieve Goals: Good Progress towards PT goals: Not progressing toward goals - comment (Increased pain)    Frequency    7X/week  PT Plan Current plan remains appropriate    Co-evaluation              AM-PAC PT "6 Clicks" Mobility   Outcome Measure  Help needed turning from your back to your side while in a flat bed without using bedrails?: A Little Help needed moving from lying on your back to sitting on the side of a flat bed without using bedrails?: A Little Help needed moving to and from a bed to a chair (including a wheelchair)?: A Little Help needed standing up from a chair using your arms (e.g., wheelchair or bedside chair)?: A Little Help needed to walk in hospital room?: A Little Help needed climbing 3-5 steps with a railing? : A Little 6 Click Score: 18    End of Session Equipment Utilized During Treatment: Gait belt Activity Tolerance: Patient limited by pain Patient left: in chair;with call bell/phone within reach;with chair alarm  set Nurse Communication: Mobility status;Patient requests pain meds PT Visit Diagnosis: Muscle weakness (generalized) (M62.81);Difficulty in walking, not elsewhere classified (R26.2)     Time: 1572-6203 PT Time Calculation (min) (ACUTE ONLY): 17 min  Charges:  $Therapeutic Activity: 8-22 mins                     [place] Pager [phone] Office [phone]    Justn Quale 09/28/2020, 11:55 AM

## 2020-09-30 ENCOUNTER — Encounter (HOSPITAL_COMMUNITY): Payer: Self-pay | Admitting: Orthopaedic Surgery

## 2020-09-30 ENCOUNTER — Telehealth: Payer: Self-pay

## 2020-09-30 NOTE — Telephone Encounter (Signed)
Please advise 

## 2020-09-30 NOTE — Telephone Encounter (Signed)
Patient's wife Randell Patient called stating that patient was having fever and chills on Saturday, 09/28/2020, but got worse on Sunday, 03/01/2021.  Stated that his fever was 101.2, then down to 100, now its 98.5 due to patient taking Tylenol.  Would like to know if this is normal?  Patient had left hip surgery on Friday, 09/27/2020.  CB#[phone].  Please advise.

## 2020-09-30 NOTE — Telephone Encounter (Signed)
Tell them that this is entirely normal.  Being upright and coughing and deep breathing and using the incentive spirometer will help.

## 2020-09-30 NOTE — Telephone Encounter (Signed)
Called and informed the wife, shestated understanding

## 2020-10-03 ENCOUNTER — Inpatient Hospital Stay: Payer: Federal, State, Local not specified - PPO | Admitting: Orthopaedic Surgery

## 2020-10-10 ENCOUNTER — Encounter: Payer: Self-pay | Admitting: Orthopaedic Surgery

## 2020-10-10 ENCOUNTER — Ambulatory Visit (INDEPENDENT_AMBULATORY_CARE_PROVIDER_SITE_OTHER): Payer: Federal, State, Local not specified - PPO | Admitting: Orthopaedic Surgery

## 2020-10-10 DIAGNOSIS — Z96642 Presence of left artificial hip joint: Secondary | ICD-10-CM

## 2020-10-10 NOTE — Progress Notes (Signed)
The patient is 2 weeks tomorrow status post a left total hip arthroplasty.  He is doing well overall and reports good range of motion and strength.  He is walking with a walking stick.  His left hip incision looks good.  Remove the staples in place Steri-Strips.  He is on a baby aspirin twice a day.  He was on this once a day before surgery so he will go back to just once a day.  He is on gabapentin and Robaxin.  The gabapentin is making him tired.  He is on 100 mg 3 times a day.  Told him to go down to just once a day in the evening and then after we can stop that.  He can drive from my standpoint once he is comfortable.  He will remain out of work until we see him back in 4 weeks from now and can make a determination about return to work status.  All questions and concerns were answered and addressed.

## 2020-10-14 ENCOUNTER — Telehealth: Payer: Self-pay

## 2020-10-14 NOTE — Telephone Encounter (Signed)
Pt called and would like a call back regarding a few questions from his surgery.

## 2020-10-14 NOTE — Telephone Encounter (Signed)
Patient lift anything greater than 25 pounds now and can increase this to up to 50 pounds in 2 to 3 weeks.  He can also have his booster at any point.

## 2020-10-14 NOTE — Telephone Encounter (Signed)
Asking if he can mow, how much he can lift and if he can get his COVID booster soon?

## 2020-10-15 NOTE — Telephone Encounter (Signed)
Patient aware of the below  

## 2020-11-11 ENCOUNTER — Encounter: Payer: Self-pay | Admitting: Orthopaedic Surgery

## 2020-11-11 ENCOUNTER — Telehealth: Payer: Self-pay | Admitting: Orthopaedic Surgery

## 2020-11-11 ENCOUNTER — Ambulatory Visit (INDEPENDENT_AMBULATORY_CARE_PROVIDER_SITE_OTHER): Payer: Federal, State, Local not specified - PPO | Admitting: Orthopaedic Surgery

## 2020-11-11 DIAGNOSIS — Z96642 Presence of left artificial hip joint: Secondary | ICD-10-CM

## 2020-11-11 NOTE — Progress Notes (Signed)
George Hays is now 6 weeks status post a left total hip arthroplasty.  He is doing very well overall.  There are some numbness around his incision.  He is walking without assistive device and has a little bit of a limp.  He does feel like he is able to go back to his full work duties without restrictions starting June 5.  Both hips move smoothly and fluidly.  His leg lengths are equal.  He looks great overall.  He did describe what his work involves including being able to stand and walk at least 3 miles and lifting up to 50 pounds.  I agree with him being able to do this.  From my standpoint I do not need to see him back for 6 months unless there are issues.  At that visit I like a standing low AP pelvis and lateral of his left operative hip.

## 2020-11-11 NOTE — Telephone Encounter (Signed)
Received medical records release form,disability/FMLA and $25.00 cash   Forwarding to [employer] today

## 2020-11-26 ENCOUNTER — Telehealth: Payer: Self-pay | Admitting: Orthopaedic Surgery

## 2020-11-26 NOTE — Telephone Encounter (Signed)
Corrected

## 2020-11-26 NOTE — Telephone Encounter (Signed)
Pt called requesting we remove medications from his account that he no longer takes/needs filled. Those are gabapentin, methocarbamol, and oxycodone per pt. The call back number is [phone] if there are any questions.

## 2021-01-02 ENCOUNTER — Emergency Department (HOSPITAL_COMMUNITY)
Admission: EM | Admit: 2021-01-02 | Discharge: 2021-01-02 | Disposition: A | Discharge status: Home / Self Care | Payer: Federal, State, Local not specified - PPO | Attending: Emergency Medicine | Admitting: Emergency Medicine

## 2021-01-02 ENCOUNTER — Other Ambulatory Visit: Payer: Self-pay

## 2021-01-02 DIAGNOSIS — Z87891 Personal history of nicotine dependence: Secondary | ICD-10-CM | POA: Insufficient documentation

## 2021-01-02 DIAGNOSIS — Z79899 Other long term (current) drug therapy: Secondary | ICD-10-CM | POA: Insufficient documentation

## 2021-01-02 DIAGNOSIS — N4832 Priapism due to disease classified elsewhere: Secondary | ICD-10-CM | POA: Diagnosis present

## 2021-01-02 DIAGNOSIS — I10 Essential (primary) hypertension: Secondary | ICD-10-CM | POA: Insufficient documentation

## 2021-01-02 DIAGNOSIS — Z7982 Long term (current) use of aspirin: Secondary | ICD-10-CM | POA: Diagnosis not present

## 2021-01-02 DIAGNOSIS — Z96642 Presence of left artificial hip joint: Secondary | ICD-10-CM | POA: Insufficient documentation

## 2021-01-02 DIAGNOSIS — N4833 Priapism, drug-induced: Secondary | ICD-10-CM | POA: Insufficient documentation

## 2021-01-02 NOTE — ED Triage Notes (Addendum)
Pt went to urologist today and had an injection for erectile dysfunction, Prostin. Has had an erection now for almost 6 hours.

## 2021-01-02 NOTE — ED Provider Notes (Signed)
Emergency Medicine Provider Triage Evaluation Note  George Hays , a 55 y.o. male  was evaluated in triage.  Pt complains of priapism x6 hours. Patient was seen at the urologist today for erectile dysfunction and had a trial of Prostin. No history of priapism. He has urinated some within the past 6 hours with pain. He endorses pain in the muscle surrounding his rectum. No treatment prior to arrival. He notes symptoms have slightly improved; however not fully resolved.   Review of Systems  Positive: Priapism Negative: fever  Physical Exam  BP 137/79 (BP Location: Left Arm)   Pulse 78   Temp 98.2 F (36.8 C) (Oral)   Resp 18   SpO2 97%  Gen:   Awake, no distress   Resp:  Normal effort  MSK:   Moves extremities without difficulty  Other:  Deferred GU exam in triage  Medical Decision Making  Medically screening exam initiated at 9:47 PM.  Appropriate orders placed.  JONATHA GAGEN was informed that the remainder of the evaluation will be completed by another provider, this initial triage assessment does not replace that evaluation, and the importance of remaining in the ED until their evaluation is complete.  Priapism x6 hours. Informed charge need for room. Patient taken to room from triage.    Karie Kirks 01/02/21 2150    Dorie Rank, MD 01/05/21 [phone]

## 2021-01-02 NOTE — ED Provider Notes (Signed)
[facility] EMERGENCY DEPARTMENT Provider Note   CSN: [id number] Arrival date & time: 01/02/21  2100     History Chief Complaint  Patient presents with   Priapism    George Hays is a 55 y.o. male.  HPI  Patient presents to the ED for evaluation of persistent erection.  Patient states he has history of erectile dysfunction.  He went to the urologist today to get an injection of Prostin.  Patient went home and did have intercourse with his wife.  Patient was told to watch out for erection that was lasting more than 4 hours.  His erection persisted so he called the urologist.  Patient states he took Sudafed and applied an ice pack.  The erection persisted so he was instructed to come to the emergency room.  His doctor is affiliated with [facility] but instructed him to come to the ED here in [place] as he lives in this area.  Patient states since waiting in the ED his erection has decreased.  Past Medical History:  Diagnosis Date   Anemia    Arthritis    GERD (gastroesophageal reflux disease)    chronic indigestion   Hypertension    Pre-diabetes     Patient Active Problem List   Diagnosis Date Noted   Status post left hip replacement 09/27/2020   Unilateral primary osteoarthritis, left hip 07/06/2019   Obesity 05/08/2014   DOE (dyspnea on exertion) 03/10/2013   OSA (obstructive sleep apnea) 03/10/2013    Past Surgical History:  Procedure Laterality Date   ADENOIDECTOMY     FINGER AMPUTATION     finger repair after partial amp. -- right middle finger   TONSILLECTOMY     TOTAL HIP ARTHROPLASTY Left 09/27/2020   Procedure: LEFT TOTAL HIP ARTHROPLASTY ANTERIOR APPROACH;  Surgeon: Mcarthur Rossetti, MD;  Location: [facility];  Service: Orthopedics;  Laterality: Left;       Family History  Problem Relation Age of Onset   Diabetes Mother    Lung cancer Paternal Grandmother    Skin cancer Paternal Grandfather    Allergies Other    Cancer Other         great grandparents and aunts/uncles    Social History   Tobacco Use   Smoking status: Former    Packs/day: 4.00    Years: 15.00    Pack years: 60.00    Types: Cigarettes, Cigars    Quit date: 06/22/2002    Years since quitting: 18.5   Smokeless tobacco: Never   Tobacco comments:    Currently smokes 2-3 cigars a year. FOR 8 YEARS PT SMOKED 3-4 PPD  Vaping Use   Vaping Use: Never used  Substance Use Topics   Alcohol use: Yes    Comment: social   Drug use: No    Home Medications Prior to Admission medications   Medication Sig Start Date End Date Taking? Authorizing Provider  anastrozole (ARIMIDEX) 1 MG tablet Take 1 mg by mouth daily. 08/06/20  Yes [provider]  aspirin 81 MG chewable tablet Chew 1 tablet (81 mg total) by mouth 2 (two) times daily. Patient taking differently: Chew 81 mg by mouth every morning. 09/28/20  Yes Mcarthur Rossetti, MD  buPROPion (WELLBUTRIN XL) 300 MG 24 hr tablet Take 300 mg by mouth daily. 10/21/17  Yes [provider]  esomeprazole (NEXIUM) 40 MG capsule Take 40 mg by mouth daily.   Yes [provider]  glucosamine-chondroitin 500-400 MG tablet Take  2 tablets by mouth daily.   Yes [provider]  lisinopril-hydrochlorothiazide (PRINZIDE,ZESTORETIC) 10-12.5 MG tablet Take 1 tablet by mouth daily. 08/26/17  Yes [provider]  meloxicam (MOBIC) 7.5 MG tablet Take 7.5 mg by mouth in the morning and at bedtime. 12/10/17  Yes [provider]  OZEMPIC, 0.25 OR 0.5 MG/DOSE, 2 MG/1.5ML SOPN Inject 0.25 mg into the skin every Wednesday. 07/24/20  Yes [provider]  Prenatal Vit-Fe Fumarate-FA (PRENATAL MULTIVITAMIN) TABS tablet Take 1 tablet by mouth daily at 12 noon.   Yes [provider]  testosterone cypionate (DEPOTESTOSTERONE CYPIONATE) 200 MG/ML injection Inject 100 mg into the muscle every 14 (fourteen) days.   Yes [provider]  vortioxetine HBr (TRINTELLIX) 20 MG  TABS tablet Take 10 mg by mouth daily.   Yes [provider]  methocarbamol (ROBAXIN) 500 MG tablet Take 1 tablet (500 mg total) by mouth every 6 (six) hours as needed for muscle spasms. Patient not taking: No sig reported 09/28/20   Mcarthur Rossetti, MD  oxyCODONE (OXY IR/ROXICODONE) 5 MG immediate release tablet Take 1-2 tablets (5-10 mg total) by mouth every 4 (four) hours as needed for moderate pain (pain score 4-6). Patient not taking: No sig reported 09/28/20   Mcarthur Rossetti, MD    Allergies    Patient has no known allergies.  Review of Systems   Review of Systems  All other systems reviewed and are negative.  Physical Exam Updated Vital Signs BP 125/74   Pulse 80   Temp 98.2 F (36.8 C) (Oral)   Resp 18   SpO2 97%   Physical Exam Vitals and nursing note reviewed.  Constitutional:      General: He is not in acute distress.    Appearance: He is well-developed.  HENT:     Head: Normocephalic and atraumatic.     Right Ear: External ear normal.     Left Ear: External ear normal.  Eyes:     General: No scleral icterus.       Right eye: No discharge.        Left eye: No discharge.     Conjunctiva/sclera: Conjunctivae normal.  Neck:     Trachea: No tracheal deviation.  Cardiovascular:     Rate and Rhythm: Normal rate.  Pulmonary:     Effort: Pulmonary effort is normal. No respiratory distress.     Breath sounds: No stridor.  Abdominal:     General: There is no distension.  Genitourinary:    Comments: Penis is slightly engorged but is soft and not erect Musculoskeletal:        General: No swelling or deformity.     Cervical back: Neck supple.  Skin:    General: Skin is warm and dry.     Findings: No rash.  Neurological:     Mental Status: He is alert.     Cranial Nerves: Cranial nerve deficit: no gross deficits.    ED Results / Procedures / Treatments   Labs (all labs ordered are listed, but only abnormal results are displayed) Labs  Reviewed - No data to display  EKG None  Radiology No results found.  Procedures Procedures   Medications Ordered in ED Medications - No data to display  ED Course  I have reviewed the triage vital signs and the nursing notes.  Pertinent labs & imaging results that were available during my care of the patient were reviewed by me and considered in my medical decision  making (see chart for details).  Clinical Course as of 01/02/21 2244  Thu Jan 02, 2021  2241 Case discussed with Dr. Wilfred Lacy urology [JK]    Clinical Course User Index [JK] Dorie Rank, MD   MDM Rules/Calculators/A&P                          Patient presented with concerns of priapism associated with recent injection for impotence.  Patient had prolonged erection but fortunately started have spontaneous detumescence while waiting in the ED.  On exam now the penis is soft, corpora do not appear to be engorged.  No indication for sympathomimetic injection at this time.  I did discuss the case with urology.  Patient instructed to follow-up with his urologist and not use any further treatment for erectile dysfunction until discussed with them Final Clinical Impression(s) / ED Diagnoses Final diagnoses:  Priapism, drug-induced    Rx / DC Orders ED Discharge Orders     None        Dorie Rank, MD 01/02/21 2244

## 2021-01-02 NOTE — Discharge Instructions (Addendum)
Make sure not to use the medication again until you discuss with your neurologist

## 2021-01-29 ENCOUNTER — Telehealth: Payer: Self-pay | Admitting: Orthopaedic Surgery

## 2021-01-29 NOTE — Telephone Encounter (Signed)
Pt submitted medical release forms, short term disability, and $25.00 cash payment to [employer]. Accepted 01/29/21

## 2021-02-07 ENCOUNTER — Telehealth: Payer: Self-pay | Admitting: Orthopaedic Surgery

## 2021-02-07 NOTE — Telephone Encounter (Signed)
Received medical records release form from patient  

## 2021-05-10 ENCOUNTER — Emergency Department (HOSPITAL_COMMUNITY)
Admission: EM | Admit: 2021-05-10 | Discharge: 2021-05-10 | Disposition: A | Discharge status: Home / Self Care | Payer: Federal, State, Local not specified - PPO | Attending: Emergency Medicine | Admitting: Emergency Medicine

## 2021-05-10 DIAGNOSIS — M79672 Pain in left foot: Secondary | ICD-10-CM | POA: Diagnosis present

## 2021-05-10 DIAGNOSIS — M109 Gout, unspecified: Secondary | ICD-10-CM | POA: Diagnosis not present

## 2021-05-10 DIAGNOSIS — Z79899 Other long term (current) drug therapy: Secondary | ICD-10-CM | POA: Diagnosis not present

## 2021-05-10 DIAGNOSIS — Z87891 Personal history of nicotine dependence: Secondary | ICD-10-CM | POA: Diagnosis not present

## 2021-05-10 DIAGNOSIS — Z96642 Presence of left artificial hip joint: Secondary | ICD-10-CM | POA: Insufficient documentation

## 2021-05-10 DIAGNOSIS — I10 Essential (primary) hypertension: Secondary | ICD-10-CM | POA: Insufficient documentation

## 2021-05-10 MED ORDER — OXYCODONE-ACETAMINOPHEN 5-325 MG PO TABS: 1.0000 | ORAL_TABLET | Freq: Four times a day (QID) | ORAL | 0 refills | Status: AC | PRN

## 2021-05-10 MED ORDER — PREDNISONE 20 MG PO TABS: ORAL_TABLET | ORAL | 0 refills | Status: DC

## 2021-05-10 MED ORDER — OXYCODONE-ACETAMINOPHEN 5-325 MG PO TABS: 1.0000 | ORAL_TABLET | Freq: Four times a day (QID) | ORAL | 0 refills | Status: DC | PRN

## 2021-05-10 NOTE — Discharge Instructions (Addendum)
Please read and follow all provided instructions.  Your diagnoses today include:  1. Acute gout of left foot, unspecified cause     Tests performed today include: Vital signs. See below for your results today.   Medications prescribed:  Prednisone - steroid medicine   It is best to take this medication in the morning to prevent sleeping problems. If you are diabetic, monitor your blood sugar closely and stop taking Prednisone if blood sugar is over 300. Take with food to prevent stomach upset.   Percocet (oxycodone/acetaminophen) - narcotic pain medication  DO NOT drive or perform any activities that require you to be awake and alert because this medicine can make you drowsy. BE VERY CAREFUL not to take multiple medicines containing Tylenol (also called acetaminophen). Doing so can lead to an overdose which can damage your liver and cause liver failure and possibly death.  Take any prescribed medications only as directed.  Home care instructions:  Follow any educational materials contained in this packet.  BE VERY CAREFUL not to take multiple medicines containing Tylenol (also called acetaminophen). Doing so can lead to an overdose which can damage your liver and cause liver failure and possibly death.   Follow-up instructions: Please follow-up with your primary care provider in the next 7 days for further evaluation of your symptoms.   Return instructions:  Please return to the Emergency Department if you experience worsening symptoms.  Please return with fever or chills, streaking redness up your foot and leg, severe uncontrolled pain Please return if you have any other emergent concerns.  Additional Information:  Your vital signs today were: BP 129/68   Pulse 92   Temp 97.7 F (36.5 C) (Oral)   Resp 16   Ht 6\' 2"  (1.88 m)   SpO2 98%   BMI 26.58 kg/m  If your blood pressure (BP) was elevated above 135/85 this visit, please have this repeated by your doctor within one  month. --------------

## 2021-05-10 NOTE — ED Provider Notes (Signed)
[facility] DEPT Provider Note   CSN: [id number] Arrival date & time: 05/10/21  5465     History Chief Complaint  Patient presents with   left foot pain   left ankle swelling    George Hays is a 55 y.o. male.  Patient with history of gout several years ago in his right foot presents to the emergency department today for evaluation of left foot swelling and pain.  Symptoms started yesterday afternoon.  No injury.  Pain increased over the course of the evening and had difficulty sleeping overnight last night.  It is difficult for him to bear weight because of pain.  Pain is across the top of the foot bilaterally and into the MTP joint.  It is worse with movement.  He denies pains in other joints.  No fevers or chills.  No history of diabetes or immunocompromise.  Patient did have hip replacement earlier this year.        Past Medical History:  Diagnosis Date   Anemia    Arthritis    GERD (gastroesophageal reflux disease)    chronic indigestion   Hypertension    Pre-diabetes     Patient Active Problem List   Diagnosis Date Noted   Status post left hip replacement 09/27/2020   Unilateral primary osteoarthritis, left hip 07/06/2019   Obesity 05/08/2014   DOE (dyspnea on exertion) 03/10/2013   OSA (obstructive sleep apnea) 03/10/2013    Past Surgical History:  Procedure Laterality Date   ADENOIDECTOMY     FINGER AMPUTATION     finger repair after partial amp. -- right middle finger   TONSILLECTOMY     TOTAL HIP ARTHROPLASTY Left 09/27/2020   Procedure: LEFT TOTAL HIP ARTHROPLASTY ANTERIOR APPROACH;  Surgeon: Mcarthur Rossetti, MD;  Location: [facility];  Service: Orthopedics;  Laterality: Left;       Family History  Problem Relation Age of Onset   Diabetes Mother    Lung cancer Paternal Grandmother    Skin cancer Paternal Grandfather    Allergies Other    Cancer Other        great grandparents and aunts/uncles    Social History    Tobacco Use   Smoking status: Former    Packs/day: 4.00    Years: 15.00    Pack years: 60.00    Types: Cigarettes, Cigars    Quit date: 06/22/2002    Years since quitting: 18.8   Smokeless tobacco: Never   Tobacco comments:    Currently smokes 2-3 cigars a year. FOR 8 YEARS PT SMOKED 3-4 PPD  Vaping Use   Vaping Use: Never used  Substance Use Topics   Alcohol use: Yes    Comment: social   Drug use: No    Home Medications Prior to Admission medications   Medication Sig Start Date End Date Taking? Authorizing Provider  anastrozole (ARIMIDEX) 1 MG tablet Take 1 mg by mouth daily. 08/06/20   [provider]  aspirin 81 MG chewable tablet Chew 1 tablet (81 mg total) by mouth 2 (two) times daily. Patient taking differently: Chew 81 mg by mouth every morning. 09/28/20   Mcarthur Rossetti, MD  buPROPion (WELLBUTRIN XL) 300 MG 24 hr tablet Take 300 mg by mouth daily. 10/21/17   [provider]  esomeprazole (NEXIUM) 40 MG capsule Take 40 mg by mouth daily.    [provider]  glucosamine-chondroitin 500-400 MG tablet Take 2 tablets by mouth daily.    [provider]  lisinopril-hydrochlorothiazide (PRINZIDE,ZESTORETIC) 10-12.5 MG tablet Take 1 tablet by mouth daily. 08/26/17   [provider]  meloxicam (MOBIC) 7.5 MG tablet Take 7.5 mg by mouth in the morning and at bedtime. 12/10/17   [provider]  methocarbamol (ROBAXIN) 500 MG tablet Take 1 tablet (500 mg total) by mouth every 6 (six) hours as needed for muscle spasms. Patient not taking: No sig reported 09/28/20   Mcarthur Rossetti, MD  oxyCODONE (OXY IR/ROXICODONE) 5 MG immediate release tablet Take 1-2 tablets (5-10 mg total) by mouth every 4 (four) hours as needed for moderate pain (pain score 4-6). Patient not taking: No sig reported 09/28/20   Mcarthur Rossetti, MD  OZEMPIC, 0.25 OR 0.5 MG/DOSE, 2 MG/1.5ML SOPN Inject 0.25 mg into the skin every Wednesday. 07/24/20    [provider]  Prenatal Vit-Fe Fumarate-FA (PRENATAL MULTIVITAMIN) TABS tablet Take 1 tablet by mouth daily at 12 noon.    [provider]  testosterone cypionate (DEPOTESTOSTERONE CYPIONATE) 200 MG/ML injection Inject 100 mg into the muscle every 14 (fourteen) days.    [provider]  vortioxetine HBr (TRINTELLIX) 20 MG TABS tablet Take 10 mg by mouth daily.    [provider]    Allergies    Patient has no known allergies.  Review of Systems   Review of Systems  Constitutional:  Negative for chills and fever.  Gastrointestinal:  Negative for nausea and vomiting.  Musculoskeletal:  Positive for arthralgias, gait problem and joint swelling. Negative for back pain and neck pain.  Skin:  Negative for wound.  Neurological:  Negative for weakness and numbness.   Physical Exam Updated Vital Signs BP 128/82 (BP Location: Left Arm)   Pulse 86   Temp 97.7 F (36.5 C) (Oral)   Resp 16   Ht 6\' 2"  (1.88 m)   SpO2 96%   BMI 26.58 kg/m   Physical Exam Vitals and nursing note reviewed.  Constitutional:      Appearance: He is well-developed.  HENT:     Head: Normocephalic and atraumatic.  Eyes:     Conjunctiva/sclera: Conjunctivae normal.  Pulmonary:     Effort: No respiratory distress.  Musculoskeletal:     Cervical back: Normal range of motion and neck supple.     Left hip: No bony tenderness. Normal range of motion.     Left knee: No bony tenderness. Normal range of motion.     Left ankle: Tenderness present. Decreased range of motion.     Right foot: Normal pulse (2+ DP pulse).     Left foot: Decreased range of motion. Swelling and tenderness present. Normal pulse (2+ DP pulse).     Comments: Left foot: There is tenderness across the superior portion of the forefoot.  Extending from the lateral malleolus across the top of the foot.  Also tender with movement in the MTP area.  Mild, poorly demarcated erythema, mild warmth midfoot.  No  tenderness or swelling on the plantar aspect.  Skin:    General: Skin is warm and dry.  Neurological:     Mental Status: He is alert.    ED Results / Procedures / Treatments   Labs (all labs ordered are listed, but only abnormal results are displayed) Labs Reviewed - No data to display  EKG None  Radiology No results found.  Procedures Procedures   Medications Ordered in ED Medications - No data to display  ED Course  I have reviewed the triage vital signs  and the nursing notes.  Pertinent labs & imaging results that were available during my care of the patient were reviewed by me and considered in my medical decision making (see chart for details).  Patient seen and examined.  Patient with history and physical, most consistent with a gout flare.  I have low concern for septic arthritis at this point.  No large effusions.  No well demarcated erythema consistent with cellulitis and patient has arthralgia, especially with movement, consistent with gout.  No indication for emergent orthopedic evaluation at this point.  Vital signs reviewed and are as follows: BP 128/82 (BP Location: Left Arm)   Pulse 86   Temp 97.7 F (36.5 C) (Oral)   Resp 16   Ht 6\' 2"  (1.88 m)   SpO2 96%   BMI 26.58 kg/m   Offered treatment with oral prednisone, pain medication.  Patient is currently trying to figure out his insurance situation specifically in relation to his co-pay.  He has not yet decided if he wants treatment because of his insurance questions.  We will check back.  12:06 PM patient agrees with current treatment plan including taper course of prednisone, #8 Percocet with transition to Tylenol/NSAIDs for pain.  We discussed strict return precautions which would be more concerning such as fever, severe uncontrolled pain, streaking redness which extends from the foot up the ankle and the leg.  Discussed that this may indicate more of an infectious process which is less likely  currently.  Patient seems reliable to return with worsening.    MDM Rules/Calculators/A&P                           Patient with what appears to be a flare of inflammatory type arthritis to his left foot and great toe.  He has a history of gout occurring on the right side.  He does not have a history of immunocompromise or diabetes.  Lower concern for infection at this point.  We will treat with prednisone, pain medicine, and monitor for improvement.  Lower extremity is neurovascularly intact with normal pedal pulses.  No concern for DVT/arterial insufficiency.   Final Clinical Impression(s) / ED Diagnoses Final diagnoses:  Acute gout of left foot, unspecified cause    Rx / DC Orders ED Discharge Orders          Ordered    predniSONE (DELTASONE) 20 MG tablet        05/10/21 1201    oxyCODONE-acetaminophen (PERCOCET/ROXICET) 5-325 MG tablet  Every 6 hours PRN,   Status:  Discontinued        05/10/21 1201    oxyCODONE-acetaminophen (PERCOCET/ROXICET) 5-325 MG tablet  Every 6 hours PRN        05/10/21 1203             Carlisle Cater, PA-C 05/10/21 [place], Alvin Critchley, DO 05/10/21 1525

## 2021-05-10 NOTE — ED Notes (Signed)
PA at bedside.

## 2021-05-10 NOTE — ED Triage Notes (Signed)
Patient reports pain in left foot/ankle starting yesterday. Ankle visibly swollen in triage. Patient denies injury. Pain is 8/10. Says he has history of gout in right foot but this feels way more intense.

## 2021-05-22 ENCOUNTER — Ambulatory Visit: Payer: Federal, State, Local not specified - PPO | Admitting: Orthopaedic Surgery

## 2021-05-22 ENCOUNTER — Encounter: Payer: Self-pay | Admitting: Orthopaedic Surgery

## 2021-05-22 ENCOUNTER — Ambulatory Visit: Payer: Self-pay

## 2021-05-22 ENCOUNTER — Other Ambulatory Visit: Payer: Self-pay

## 2021-05-22 DIAGNOSIS — Z96642 Presence of left artificial hip joint: Secondary | ICD-10-CM | POA: Diagnosis not present

## 2021-05-22 NOTE — Progress Notes (Signed)
The patient is a 55 year old gentleman who is now 7 months status post a left total hip arthroplasty.  He reports he is so much better than what he was before surgery.  He reports increased range of motion and increase strength with the left hip.  On exam his left operative hip moves smoothly and fluidly.  An AP pelvis and lateral left hip shows a well-seated total hip arthroplasty with no complicating features.  At this point follow-up can be as needed for his left hip unless he has issues at all.  I talked him in length in detail about the things that would need to bring him back for that left hip.  All question concerns were answered and addressed.

## 2022-04-21 IMAGING — DX DG PORTABLE PELVIS
1 series · 1 of 1 positions shown · non-contrast
Comparison: None.

CLINICAL DATA: Status post left hip replacement.

EXAM:
PORTABLE PELVIS 1-2 VIEWS

[pelvis ap]
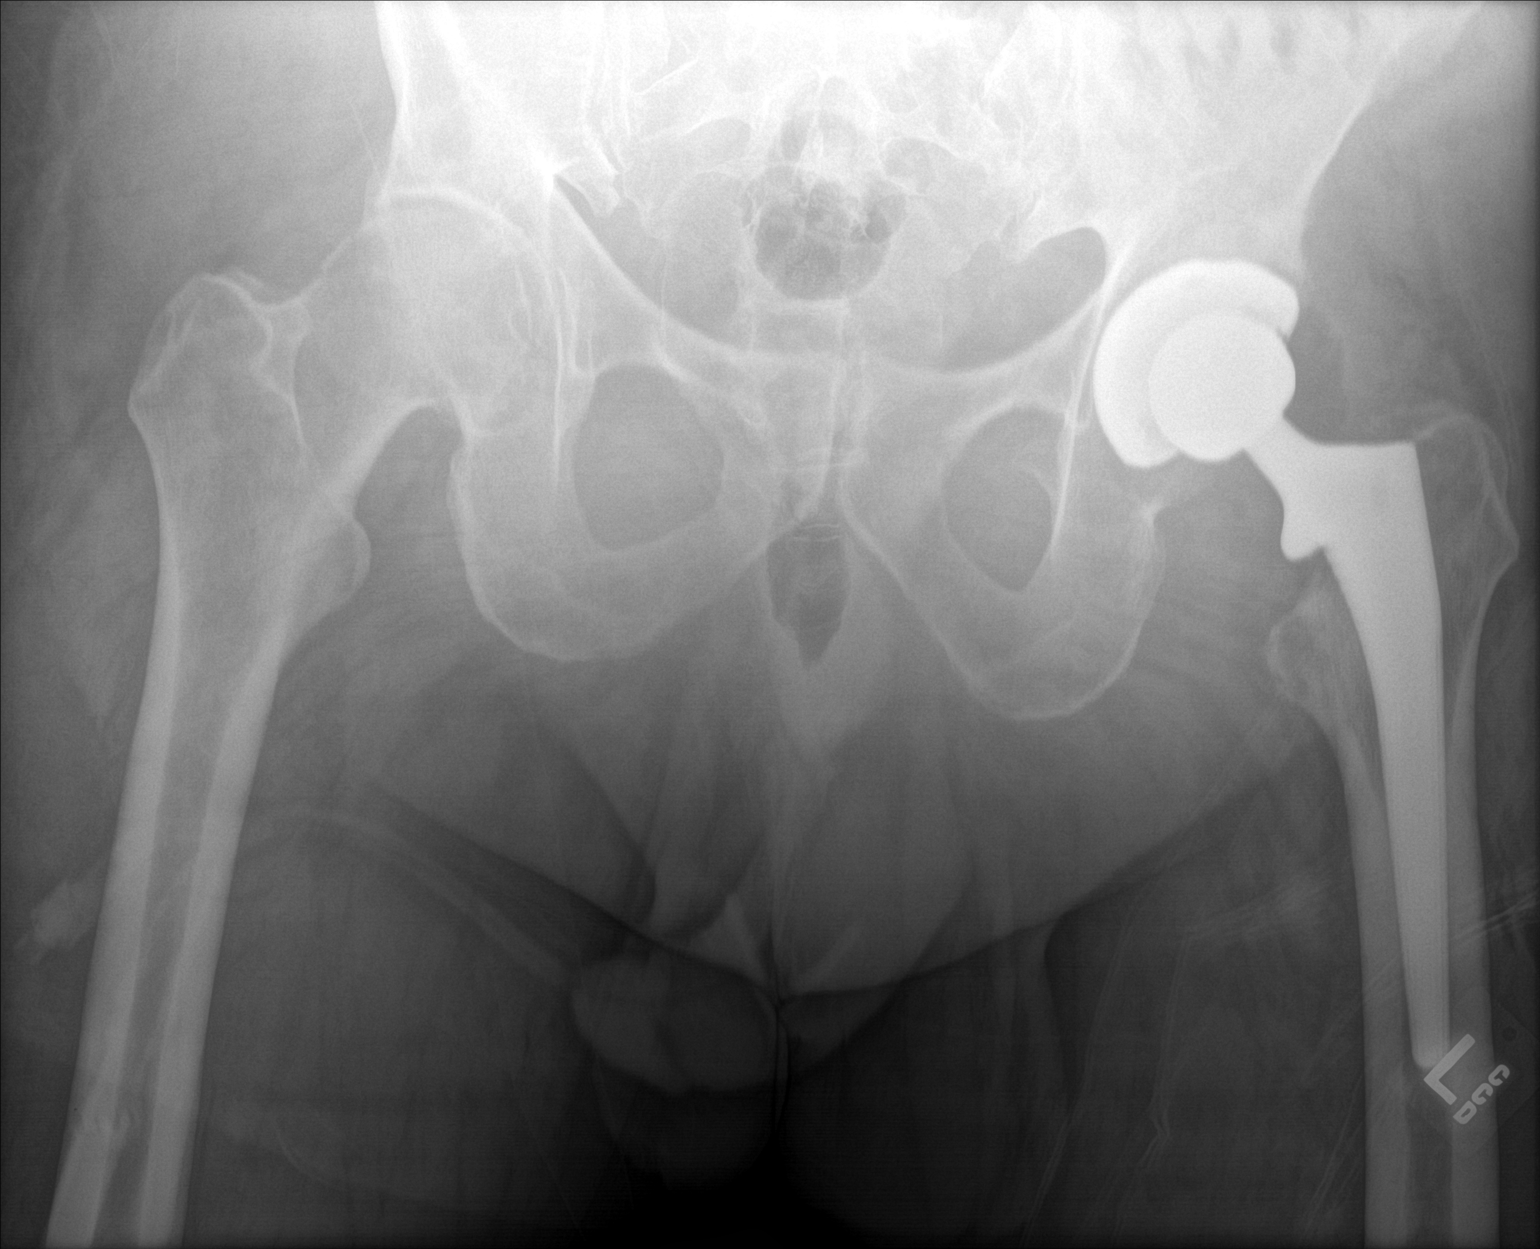

[1 of 1 positions shown; findings below may reference images not displayed]

FINDINGS: The left acetabular and femoral components are well situated.
Expected postoperative changes are seen in the surrounding soft
tissues.
IMPRESSION: Status post left total hip arthroplasty.

## 2022-05-19 ENCOUNTER — Telehealth: Payer: Self-pay | Admitting: Gastroenterology

## 2022-05-19 NOTE — Telephone Encounter (Signed)
HI Dr. Fuller Plan,   Patient called requesting to continue his GI care with [facility], patient mentioned you as his preference within the practice. He previous had a colonoscopy\EGD in 2015 with Dr. Scarlette Calico, [place]. Records were requested and obtained for you to review and advise on scheduling.   FYI: The patient is wanting to possibly have a double procedure before the end of this year.   Thank you

## 2022-05-27 ENCOUNTER — Ambulatory Visit: Payer: Federal, State, Local not specified - PPO | Admitting: Gastroenterology

## 2022-05-27 ENCOUNTER — Encounter: Payer: Self-pay | Admitting: Gastroenterology

## 2022-05-27 VITALS — BP 118/68 | HR 85 | Ht 74.0 in | Wt 275.0 lb

## 2022-05-27 DIAGNOSIS — K219 Gastro-esophageal reflux disease without esophagitis: Secondary | ICD-10-CM

## 2022-05-27 DIAGNOSIS — Z86010 Personal history of colonic polyps: Secondary | ICD-10-CM

## 2022-05-27 DIAGNOSIS — K2270 Barrett's esophagus without dysplasia: Secondary | ICD-10-CM | POA: Diagnosis not present

## 2022-05-27 MED ORDER — NA SULFATE-K SULFATE-MG SULF 17.5-3.13-1.6 GM/177ML PO SOLN: 1.0000 | Freq: Once | ORAL | 0 refills | Status: AC

## 2022-05-27 NOTE — Progress Notes (Addendum)
Assessment    Personal history of adenomatous colon polyps GERD, Barrett's esophagus Mild normocytic anemia in 2022 OSA Obesity, intentional weight loss on Ozempic   Recommendations   Continue Nexium 40 mg qd and antireflux measures Schedule colonoscopy and EGD. The risks (including bleeding, perforation, infection, missed lesions, medication reactions and possible hospitalization or surgery if complications occur), benefits, and alternatives to colonoscopy with possible biopsy and possible polypectomy were discussed with the patient and they consent to proceed.  The risks (including bleeding, perforation, infection, missed lesions, medication reactions and possible hospitalization or surgery if complications occur), benefits, and alternatives to endoscopy with possible biopsy and possible dilation were discussed with the patient and they consent to proceed.   CBC   HPI   Chief complaint: Personal history of adenomatous, GERD, history of Barrett's esophagus  Patient profile:  George Hays is a 56 y.o. male referred by Horald Pollen, MD for a personal history of adenomatous and a history of Barrett's esophagus. We have records from his last procedures in 2015 however prior procedure and pathology records are not availale at this time. He relates 3 prior colonoscopies with large adenomatous polyps found on 2 prior colonoscopies.  His most recent colonoscopy in 2015 was polyp free.  He states he has had 3 prior EGDs with 2 earlier EGDs showing Barrett's esophagus.  His last EGD did not show Barrett's on pathology.   His reflux symptoms are under good control on daily Nexium however he does have occasional breakthrough symptoms.  No other gastrointestinal complaints.  He relates he has intentionally lost about 90 pounds over 1.5 years. Denies abdominal pain, constipation, diarrhea, change in stool caliber, melena, hematochezia, nausea, vomiting, dysphagia, chest pain.    Previous Labs /  Imaging::    Latest Ref Rng & Units 09/28/2020    3:05 AM 09/20/2020    9:52 AM 05/08/2014    8:53 AM  CBC  WBC 4.0 - 10.5 K/uL 12.2  5.9  9.2   Hemoglobin 13.0 - 17.0 g/dL 11.0  13.5  14.0   Hematocrit 39.0 - 52.0 % 33.4  41.2  42.5   Platelets 150 - 400 K/uL 301  334      No results found for: "LIPASE"    Latest Ref Rng & Units 09/28/2020    3:05 AM 09/20/2020    9:52 AM 01/23/2013   12:21 AM  CMP  Glucose 70 - 99 mg/dL 178  92  90   BUN 6 - 20 mg/dL '12  15  10   '$ Creatinine 0.61 - 1.24 mg/dL 1.06  1.12  1.23   Sodium 135 - 145 mmol/L 135  140  139   Potassium 3.5 - 5.1 mmol/L 3.5  4.0  4.2   Chloride 98 - 111 mmol/L 97  103  102   CO2 22 - 32 mmol/L '30  28  29   '$ Calcium 8.9 - 10.3 mg/dL 8.2  9.2  8.8      Previous GI evaluation    Endoscopies:  Colonoscopy 05/2014 left colon diverticulosis, hyperemia in cecum o/w negative. Path: nonspecific colitis  EGD 05/2014 [place] of Barrett's, gastritis. 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   TONSILLECTOMY     TOTAL HIP ARTHROPLASTY Left 09/27/2020   Procedure: LEFT TOTAL HIP ARTHROPLASTY ANTERIOR APPROACH;  Surgeon: Mcarthur Rossetti, MD;  Location: [facility];  Service: Orthopedics;  Laterality: Left;   Family History  Problem Relation Age of Onset   Diabetes Mother    Lung cancer Paternal Grandmother    Skin cancer Paternal Grandfather    Allergies Other    Cancer Other        great grandparents and aunts/uncles   Social History   Tobacco Use   Smoking status: Former    Packs/day: 4.00    Years: 15.00    Total pack years: 60.00    Types: Cigarettes, Cigars    Quit date: 06/22/2002    Years since quitting:  19.9   Smokeless tobacco: Never   Tobacco comments:    Currently smokes 2-3 cigars a year. FOR 8 YEARS PT SMOKED 3-4 PPD  Vaping Use   Vaping Use: Never used  Substance Use Topics   Alcohol use: Yes    Comment: social   Drug use: No   Current Outpatient Medications  Medication Sig Dispense Refill   anastrozole (ARIMIDEX) 1 MG tablet Take 1 mg by mouth daily.     aspirin 81 MG chewable tablet Chew 1 tablet (81 mg total) by mouth 2 (two) times daily. (Patient taking differently: Chew 81 mg by mouth every morning.) 30 tablet 0   buPROPion (WELLBUTRIN XL) 300 MG 24 hr tablet Take 300 mg by mouth daily.     esomeprazole (NEXIUM) 40 MG capsule Take 40 mg by mouth daily.     glucosamine-chondroitin 500-400 MG tablet Take 2 tablets by mouth daily.     lisinopril-hydrochlorothiazide (PRINZIDE,ZESTORETIC) 10-12.5 MG tablet Take 1 tablet by mouth daily.     meloxicam (MOBIC) 7.5 MG tablet Take 7.5 mg by mouth in the morning and at bedtime.     methocarbamol (ROBAXIN) 500 MG tablet Take 1 tablet (500 mg total) by mouth every 6 (six) hours as needed for muscle spasms. (Patient not taking: No sig reported) 40 tablet 1   oxyCODONE-acetaminophen (PERCOCET/ROXICET) 5-325 MG tablet Take 1 tablet by mouth every 6 (six) hours as needed for severe pain. 8 tablet 0   OZEMPIC, 0.25 OR 0.5 MG/DOSE, 2 MG/1.5ML SOPN Inject 0.25 mg into the skin every Wednesday.     predniSONE (DELTASONE) 20 MG tablet 3 Tabs PO Days 1-3, then 2 tabs PO Days 4-6, then 1 tab PO Day 7-9, then Half Tab PO Day 10-12 20 tablet 0   Prenatal Vit-Fe Fumarate-FA (PRENATAL MULTIVITAMIN) TABS tablet Take 1 tablet by mouth daily at 12 noon.     testosterone cypionate (DEPOTESTOSTERONE CYPIONATE) 200 MG/ML injection Inject 100 mg into the muscle every 14 (fourteen) days.     vortioxetine HBr (TRINTELLIX) 20 MG TABS tablet Take 10 mg by mouth daily.     No current facility-administered medications for this visit.   No Known Allergies  Review  of Systems: All other systems reviewed and negative except where noted in HPI.    Physical Exam    Wt Readings from Last 3 Encounters:  09/27/20 207 lb (93.9 kg)  09/20/20 207 lb (93.9 kg)  08/01/20 (!) 309 lb 6.4 oz (140.3 kg)    There were no vitals taken for this visit. Constitutional:  Generally well appearing male in no acute distress. HEENT: Pupils normal.  Conjunctivae are normal. No scleral icterus. No oral lesions or deformities noted.  Neck: Supple.  Cardiac: Normal rate, regular rhythm without murmurs. Pulmonary/chest: Effort normal and breath sounds normal. No wheezing, rales or rhonchi. Abdominal: Soft, nondistended, nontender. Active bowel sounds. No palpable HSM, masses or hernias. Rectal: Deferred to colonoscopy  Extremities: No edema or deformities noted Neurological: Alert and oriented to person, place and time. Psychiatric: Pleasant. Normal mood and affect. Behavior is normal. Skin: Skin is warm and dry. No rashes noted.  Lucio Edward, MD   cc:  Referring Provider Hayden Rasmussen, MD

## 2022-05-27 NOTE — Patient Instructions (Signed)
You have been scheduled for an endoscopy and colonoscopy. Please follow the written instructions given to you at your visit today. Please pick up your prep supplies at the pharmacy within the next 1-3 days. If you use inhalers (even only as needed), please bring them with you on the day of your procedure.  The [facility] providers would like to encourage you to use MYCHART to communicate with providers for non-urgent requests or questions.  Due to long hold times on the telephone, sending your provider a message by MYCHART may be a faster and more efficient way to get a response.  Please allow 48 business hours for a response.  Please remember that this is for non-urgent requests.   Due to recent changes in healthcare laws, you may see the results of your imaging and laboratory studies on MyChart before your provider has had a chance to review them.  We understand that in some cases there may be results that are confusing or concerning to you. Not all laboratory results come back in the same time frame and the provider may be waiting for multiple results in order to interpret others.  Please give us 48 hours in order for your provider to thoroughly review all the results before contacting the office for clarification of your results.   Thank you for choosing me and [facility].  Malcolm T. Stark, Jr., MD., FACG  

## 2022-06-04 ENCOUNTER — Encounter: Payer: Self-pay | Admitting: Gastroenterology

## 2022-06-04 ENCOUNTER — Ambulatory Visit (AMBULATORY_SURGERY_CENTER): Payer: Federal, State, Local not specified - PPO | Admitting: Gastroenterology

## 2022-06-04 VITALS — BP 122/71 | HR 66 | Temp 98.6°F | Resp 15 | Ht 74.0 in | Wt 275.0 lb

## 2022-06-04 DIAGNOSIS — Z86010 Personal history of colonic polyps: Secondary | ICD-10-CM | POA: Diagnosis not present

## 2022-06-04 DIAGNOSIS — D123 Benign neoplasm of transverse colon: Secondary | ICD-10-CM

## 2022-06-04 DIAGNOSIS — Z8719 Personal history of other diseases of the digestive system: Secondary | ICD-10-CM | POA: Diagnosis not present

## 2022-06-04 DIAGNOSIS — Z09 Encounter for follow-up examination after completed treatment for conditions other than malignant neoplasm: Secondary | ICD-10-CM

## 2022-06-04 DIAGNOSIS — K219 Gastro-esophageal reflux disease without esophagitis: Secondary | ICD-10-CM

## 2022-06-04 DIAGNOSIS — K2270 Barrett's esophagus without dysplasia: Secondary | ICD-10-CM

## 2022-06-04 MED ORDER — FLEET ENEMA 7-19 GM/118ML RE ENEM
1.0000 | ENEMA | Freq: Once | RECTAL | Status: AC
  Administered 2022-06-04: 1 via RECTAL

## 2022-06-04 MED ORDER — SODIUM CHLORIDE 0.9 % IV SOLN: 500.0000 mL | Freq: Once | INTRAVENOUS | Status: DC

## 2022-06-04 NOTE — Progress Notes (Signed)
Pt resting comfortably. VSS. Airway intact. SBAR complete to RN. All questions answered.   

## 2022-06-04 NOTE — Op Note (Signed)
[place] Patient Name: George Hays Procedure Date: 06/04/2022 1:48 PM MRN: [id number] Endoscopist: Ladene Artist , MD, [id number] Age: 56 Referring MD:  Date of Birth: February 13, 1966 Gender: Male Account #: [id number] Procedure:                Upper GI endoscopy Indications:              Surveillance for malignancy due to personal history                            of Barrett's esophagus Medicines:                Monitored Anesthesia Care Procedure:                Pre-Anesthesia Assessment:                           - Prior to the procedure, a History and Physical                            was performed, and patient medications and                            allergies were reviewed. The patient's tolerance of                            previous anesthesia was also reviewed. The risks                            and benefits of the procedure and the sedation                            options and risks were discussed with the patient.                            All questions were answered, and informed consent                            was obtained. Prior Anticoagulants: The patient has                            taken no anticoagulant or antiplatelet agents. ASA                            Grade Assessment: III - A patient with severe                            systemic disease. After reviewing the risks and                            benefits, the patient was deemed in satisfactory                            condition to undergo the procedure.  After obtaining informed consent, the endoscope was                            passed under direct vision. Throughout the                            procedure, the patient's blood pressure, pulse, and                            oxygen saturations were monitored continuously. The                            Endoscope was introduced through the mouth, and                            advanced to the second part  of duodenum. The upper                            GI endoscopy was accomplished without difficulty.                            The patient tolerated the procedure well. Scope In: Scope Out: Findings:                 The Z-line was variable and was found at the                            gastroesophageal junction. Biopsies were taken with                            a cold forceps for histology.                           The exam of the esophagus was otherwise normal.                           The entire examined stomach was normal.                           The duodenal bulb and second portion of the                            duodenum were normal. Complications:            No immediate complications. Estimated Blood Loss:     Estimated blood loss was minimal. Impression:               - Z-line variable, at the gastroesophageal                            junction. Biopsied.                           - Normal stomach.                           -  Normal duodenal bulb and second portion of the                            duodenum. Recommendation:           - Patient has a contact number available for                            emergencies. The signs and symptoms of potential                            delayed complications were discussed with the                            patient. Return to normal activities tomorrow.                            Written discharge instructions were provided to the                            patient.                           - Resume previous diet.                           - Continue present medications.                           - Await pathology results. If Barrett's noted                            repeat EGD in 3-5 years. If Barrett's not noted                            consider discontinuing surveillance. Ladene Artist, MD 06/04/2022 2:36:10 PM This report has been signed electronically.

## 2022-06-04 NOTE — Progress Notes (Signed)
Called to room to assist during endoscopic procedure.  Patient ID and intended procedure confirmed with present staff. Received instructions for my participation in the procedure from the performing physician.  

## 2022-06-04 NOTE — Op Note (Addendum)
[place] Patient Name: George Hays Procedure Date: 06/04/2022 1:53 PM MRN: [id number] Endoscopist: Ladene Artist , MD, [id number] Age: 56 Referring MD:  Date of Birth: 03/14/1966 Gender: Male Account #: [id number] Procedure:                Colonoscopy Indications:              Surveillance: Personal history of adenomatous                            polyps on last colonoscopy > 5 years ago Medicines:                Monitored Anesthesia Care Procedure:                Pre-Anesthesia Assessment:                           - Prior to the procedure, a History and Physical                            was performed, and patient medications and                            allergies were reviewed. The patient's tolerance of                            previous anesthesia was also reviewed. The risks                            and benefits of the procedure and the sedation                            options and risks were discussed with the patient.                            All questions were answered, and informed consent                            was obtained. Prior Anticoagulants: The patient has                            taken no anticoagulant or antiplatelet agents. ASA                            Grade Assessment: III - A patient with severe                            systemic disease. After reviewing the risks and                            benefits, the patient was deemed in satisfactory                            condition to undergo the procedure.  After obtaining informed consent, the colonoscope                            was passed under direct vision. Throughout the                            procedure, the patient's blood pressure, pulse, and                            oxygen saturations were monitored continuously. The                            CF [id number] #[id number] was introduced through the anus                            and advanced to  the the cecum, identified by                            appendiceal orifice and ileocecal valve. The                            ileocecal valve, appendiceal orifice, and rectum                            were photographed. The quality of the bowel                            preparation was adequate after extensive lavage,                            suction. The colonoscopy was performed without                            difficulty. The patient tolerated the procedure                            well. Scope In: 2:02:28 PM Scope Out: 2:19:25 PM Scope Withdrawal Time: 0 hours 14 minutes 9 seconds  Total Procedure Duration: 0 hours 16 minutes 57 seconds  Findings:                 The perianal and digital rectal examinations were                            normal.                           A 5 mm polyp was found in the transverse colon. The                            polyp was sessile. The polyp was removed with a                            cold snare. Resection and retrieval were complete.  Many small and medium mouthed diverticula were                            found in the left colon. There was no evidence of                            diverticular bleeding.                           Internal hemorrhoids were found during                            retroflexion. The hemorrhoids were small and Grade                            I (internal hemorrhoids that do not prolapse).                           The exam was otherwise without abnormality on                            direct and retroflexion views. Complications:            No immediate complications. Estimated blood loss:                            None. Estimated Blood Loss:     Estimated blood loss: none. Impression:               - One 5 mm polyp in the transverse colon, removed                            with a cold snare. Resected and retrieved.                           - Moderate diverticulosis in the  left colon.                           - Internal hemorrhoids.                           - The examination was otherwise normal on direct                            and retroflexion views. Recommendation:           - Repeat colonoscopy after studies are complete for                            surveillance based on pathology results with a more                            extensive bowel prep.                           - Patient has a contact number available for  emergencies. The signs and symptoms of potential                            delayed complications were discussed with the                            patient. Return to normal activities tomorrow.                            Written discharge instructions were provided to the                            patient.                           - High fiber diet.                           - Continue present medications.                           - Await pathology results. Ladene Artist, MD 06/04/2022 2:23:16 PM This report has been signed electronically.

## 2022-06-04 NOTE — Progress Notes (Signed)
Pt's states no medical or surgical changes since previsit or office visit. VS assessed by C.W 

## 2022-06-04 NOTE — Progress Notes (Signed)
See 05/27/2022 H&P, no changes

## 2022-06-04 NOTE — Patient Instructions (Signed)
Handouts provided about hemorrhoids, high fiber diet, diverticulosis and polyps.  Resume previous diet.  Continue present medications.  Await pathology results.  If Barrett's noted repeat EGD in 3-5 years.  If Barrett's not noted consider discontinuing surveillance.  Repeat colonoscopy after studies are complete for surveillance based on pathology results with a more extensive bowel prep.     YOU HAD AN ENDOSCOPIC PROCEDURE TODAY AT [facility]:   Refer to the procedure report that was given to you for any specific questions about what was found during the examination.  If the procedure report does not answer your questions, please call your gastroenterologist to clarify.  If you requested that your care partner not be given the details of your procedure findings, then the procedure report has been included in a sealed envelope for you to review at your convenience later.  YOU SHOULD EXPECT: Some feelings of bloating in the abdomen. Passage of more gas than usual.  Walking can help get rid of the air that was put into your GI tract during the procedure and reduce the bloating. If you had a lower endoscopy (such as a colonoscopy or flexible sigmoidoscopy) you may notice spotting of blood in your stool or on the toilet paper. If you underwent a bowel prep for your procedure, you may not have a normal bowel movement for a few days.  Please Note:  You might notice some irritation and congestion in your nose or some drainage.  This is from the oxygen used during your procedure.  There is no need for concern and it should clear up in a day or so.  SYMPTOMS TO REPORT IMMEDIATELY:  Following lower endoscopy (colonoscopy or flexible sigmoidoscopy):  Excessive amounts of blood in the stool  Significant tenderness or worsening of abdominal pains  Swelling of the abdomen that is new, acute  Fever of 100F or higher  Following upper endoscopy (EGD)  Vomiting of blood or coffee ground  material  New chest pain or pain under the shoulder blades  Painful or persistently difficult swallowing  New shortness of breath  Fever of 100F or higher  Black, tarry-looking stools  For urgent or emergent issues, a gastroenterologist can be reached at any hour by calling [phone]. Do not use MyChart messaging for urgent concerns.    DIET:  We do recommend a small meal at first, but then you may proceed to your regular diet.  Drink plenty of fluids but you should avoid alcoholic beverages for 24 hours.  ACTIVITY:  You should plan to take it easy for the rest of today and you should NOT DRIVE or use heavy machinery until tomorrow (because of the sedation medicines used during the test).    FOLLOW UP: Our staff will call the number listed on your records the next business day following your procedure.  We will call around 7:15- 8:00 am to check on you and address any questions or concerns that you may have regarding the information given to you following your procedure. If we do not reach you, we will leave a message.     If any biopsies were taken you will be contacted by phone or by letter within the next 1-3 weeks.  Please call us at [phone] if you have not heard about the biopsies in 3 weeks.    SIGNATURES/CONFIDENTIALITY: You and/or your care partner have signed paperwork which will be entered into your electronic medical record.  These signatures attest to the fact that  that the information above on your After Visit Summary has been reviewed and is understood.  Full responsibility of the confidentiality of this discharge information lies with you and/or your care-partner.

## 2022-06-05 NOTE — Telephone Encounter (Signed)
Left message on f/u call 

## 2022-06-29 ENCOUNTER — Encounter: Payer: Self-pay | Admitting: Gastroenterology

## 2023-03-08 ENCOUNTER — Other Ambulatory Visit (INDEPENDENT_AMBULATORY_CARE_PROVIDER_SITE_OTHER): Payer: Federal, State, Local not specified - PPO

## 2023-03-08 ENCOUNTER — Encounter: Payer: Self-pay | Admitting: Orthopaedic Surgery

## 2023-03-08 ENCOUNTER — Ambulatory Visit: Payer: Federal, State, Local not specified - PPO | Admitting: Orthopaedic Surgery

## 2023-03-08 DIAGNOSIS — Z96642 Presence of left artificial hip joint: Secondary | ICD-10-CM

## 2023-03-08 DIAGNOSIS — M25552 Pain in left hip: Secondary | ICD-10-CM

## 2023-03-08 MED ORDER — PREDNISONE 50 MG PO TABS: ORAL_TABLET | ORAL | 0 refills | Status: AC

## 2023-03-08 MED ORDER — METHOCARBAMOL 750 MG PO TABS: 750.0000 mg | ORAL_TABLET | Freq: Three times a day (TID) | ORAL | 1 refills | Status: AC | PRN

## 2023-03-08 NOTE — Progress Notes (Signed)
The patient is a 57 year old gentleman who is 2 and half years out from a left total hip arthroplasty secondary to severe arthritis.  He says he has been having a little pain in the groin for the last few days and a couple times where his left hip has hurt him.  He denies any injuries.  He is a tall and large individual.  He denies any issues still with his right hip.  His left hip had severe bone-on-bone arthritis and he has been pleased overall but he has been having some pain and really want to get his hip checked out to make sure that he is doing okay.  On exam I can put him to internal/external rotation with minimal discomfort of his left hip.  Hip flexion does cause some pain and so I am curious if there is some inflammation of the iliopsoas tendon.  An AP pelvis and lateral left hip are compared to x-rays from December 2022 and the implant itself looks good.  It is bone ingrown and there is no evidence of loosening.  I did give him reassurance that the hip replacement looks good.  I will try methocarbamol 750 mg and 5 days of prednisone to see if this will calm down the acute inflammation.  He will rest his hip with no extremes of flexion.  If a month from now he is still experiencing pain he will come back and see [place] and we will go from there in terms of other imaging studies to consider.  All questions and concerns were addressed and answered.

## 2023-09-30 ENCOUNTER — Other Ambulatory Visit (INDEPENDENT_AMBULATORY_CARE_PROVIDER_SITE_OTHER): Payer: Self-pay

## 2023-09-30 ENCOUNTER — Ambulatory Visit: Admitting: Orthopaedic Surgery

## 2023-09-30 DIAGNOSIS — M25512 Pain in left shoulder: Secondary | ICD-10-CM

## 2023-09-30 DIAGNOSIS — G8929 Other chronic pain: Secondary | ICD-10-CM | POA: Diagnosis not present

## 2023-09-30 DIAGNOSIS — M7542 Impingement syndrome of left shoulder: Secondary | ICD-10-CM

## 2023-09-30 MED ORDER — METHYLPREDNISOLONE ACETATE 40 MG/ML IJ SUSP
40.0000 mg | INTRAMUSCULAR | Status: AC | PRN
Start: 2023-09-30 — End: 2023-09-30
  Administered 2023-09-30: 40 mg via INTRA_ARTICULAR

## 2023-09-30 MED ORDER — LIDOCAINE HCL 1 % IJ SOLN
3.0000 mL | INTRAMUSCULAR | Status: AC | PRN
Start: 2023-09-30 — End: 2023-09-30
  Administered 2023-09-30: 3 mL

## 2023-09-30 NOTE — Progress Notes (Signed)
 The patient is a well-established patient of our practice.  He comes in today with 6 months of left shoulder pain and decreased range of motion of that left shoulder.  He is 58 years old.  He is active.  He denies any specific injury.  It is started slowly but his range of motion is now decreased and he has a hard time sleeping on that shoulder at night.  He does have pain reaching behind him and overhead.  He is not a diabetic.  He does take meloxicam as an anti-inflammatory.  Examination of the left shoulder shows significant pain past 90 degrees of abduction.  His external rotation is also painful but not limited.  However his internal rotation combined with adduction is only to the lower lumbar spine level and very painful.  He does show positive Neer and Hawkins signs.  His shoulder is well located and I do not feel there is a significant weakness in the rotator cuff.  3 views of the left shoulder show no acute findings.  The shoulder is well located but there is decrease in the subacromial outlet.  He does have severe impingement syndrome of his left shoulder.  I did recommend a steroid injection in the left shoulder subacromial outlet and he agreed to this and tolerated it well.  I would like to see him back in a month after course of physical therapy as an outpatient as well.  Will see if we get this set up for him for any modalities that can decrease his shoulder pain and improve his range of motion.  He agrees with the treatment plan.  He did tolerate the steroid injection well.    Procedure Note  Patient: George Hays             Date of Birth: Jun 02, 1966           MRN: [id number]             Visit Date: 09/30/2023  Procedures: Visit Diagnoses:  1. Chronic left shoulder pain   2. Impingement syndrome of left shoulder     Large Joint Inj: L subacromial bursa on 09/30/2023 4:09 PM Indications: pain and diagnostic evaluation Details: 22 G 1.5 in needle  Arthrogram: No  Medications:  3 mL lidocaine 1 %; 40 mg methylPREDNISolone acetate 40 MG/ML Outcome: tolerated well, no immediate complications Procedure, treatment alternatives, risks and benefits explained, specific risks discussed. Consent was given by the patient. Immediately prior to procedure a time out was called to verify the correct patient, procedure, equipment, support staff and site/side marked as required. Patient was prepped and draped in the usual sterile fashion.

## 2023-10-01 ENCOUNTER — Other Ambulatory Visit: Payer: Self-pay

## 2023-10-01 DIAGNOSIS — G8929 Other chronic pain: Secondary | ICD-10-CM

## 2023-10-20 ENCOUNTER — Ambulatory Visit: Admitting: Physical Therapy

## 2023-10-21 ENCOUNTER — Ambulatory Visit: Admitting: Physical Therapy

## 2023-10-21 ENCOUNTER — Encounter: Payer: Self-pay | Admitting: Physical Therapy

## 2023-10-21 DIAGNOSIS — M25612 Stiffness of left shoulder, not elsewhere classified: Secondary | ICD-10-CM | POA: Diagnosis not present

## 2023-10-21 DIAGNOSIS — M6281 Muscle weakness (generalized): Secondary | ICD-10-CM | POA: Diagnosis not present

## 2023-10-21 DIAGNOSIS — M25512 Pain in left shoulder: Secondary | ICD-10-CM

## 2023-10-21 DIAGNOSIS — R600 Localized edema: Secondary | ICD-10-CM

## 2023-10-21 DIAGNOSIS — R293 Abnormal posture: Secondary | ICD-10-CM | POA: Diagnosis not present

## 2023-10-21 DIAGNOSIS — G8929 Other chronic pain: Secondary | ICD-10-CM

## 2023-10-21 NOTE — Therapy (Signed)
 OUTPATIENT PHYSICAL THERAPY UPPER EXTREMITY EVALUATION   Patient Name: George Hays MRN: [id number] DOB:[birth year], 58 y.o., male Today's Date: 10/21/2023  END OF SESSION:  PT End of Session - 10/21/23 0848     Visit Number 1    Number of Visits 8    Date for PT Re-Evaluation 12/16/23    Authorization Type [employer] Federal [employer] PPO    Authorization Time Period $30 copay    PT Start Time 0800    PT Stop Time 0844    PT Time Calculation (min) 44 min    Activity Tolerance Patient tolerated treatment well;Patient limited by pain    Behavior During Therapy WFL for tasks assessed/performed             Past Medical History:  Diagnosis Date   Anemia    Arthritis    GERD (gastroesophageal reflux disease)    chronic indigestion   Hypertension    Pre-diabetes    Past Surgical History:  Procedure Laterality Date   ADENOIDECTOMY     FINGER AMPUTATION     finger repair after partial amp. -- right middle finger   TONSILLECTOMY     TOTAL HIP ARTHROPLASTY Left 09/27/2020   Procedure: LEFT TOTAL HIP ARTHROPLASTY ANTERIOR APPROACH;  Surgeon: Arnie Lao, MD;  Location: [facility];  Service: Orthopedics;  Laterality: Left;   Patient Active Problem List   Diagnosis Date Noted   Status post left hip replacement 09/27/2020   Unilateral primary osteoarthritis, left hip 07/06/2019   Obesity 05/08/2014   DOE (dyspnea on exertion) 03/10/2013   OSA (obstructive sleep apnea) 03/10/2013    PCP: George Ivan, MD  REFERRING PROVIDER: Arnie Lao*  REFERRING DIAG: [id number] (ICD-10-CM) - Chronic left shoulder pain   THERAPY DIAG:  Chronic left shoulder pain Muscle weakness (generalized) Stiffness of left shoulder, not elsewhere classified Abnormal posture Localized edema   Rationale for Evaluation and Treatment: Rehabilitation  ONSET DATE: 09/30/2023  MD office visit with PT referral  SUBJECTIVE:                                                                                                                                                                                       SUBJECTIVE STATEMENT: This 58yo male saw Dr. Lucienne Ryder 09/30/23 with 6 month history of left shoulder pain and decreased range. Patient denies injury.  He had positive Neer & Hawkins signs.  X-ray showed no acute findings but decrease in the subacromial outlet. He got Cortizone shot with some improvement. Hand dominance: Left  PERTINENT HISTORY: Left THA 09/27/20, OA, gout, HTN, obesity  PAIN:  Are you having pain? Yes: NPRS scale: at  rest 2-3/10 and movement no resistance 6-7/10 in front but 10/10 reaching behind.  Lifting weight away from body hurts.  Pain location: left shoulder joint line & deltoid bursa Pain description: at rest ache, with movement no resistance sharper the higher he goes, weight makes faster Aggravating factors: moving arm especially behind him or overhead  Relieving factors: moves in good position (against chest)   PRECAUTIONS: None  RED FLAGS: None   WEIGHT BEARING RESTRICTIONS: No  FALLS:  Has patient fallen in last 6 months? 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  DIAGNOSTIC FINDINGS:  09/30/23 X-ray 3 views of the left shoulder show no acute findings.  There is decrease in the subacromial outlet.  Patient-Specific Activity Scoring Scheme  "0" represents "unable to perform." "10" represents "able to perform at prior level. 0 1 2 3 4 5 6 7 8 9  10 (Date and Score)   Activity Eval     1. Lift arm overhead 2     2. Push up from floor / dog care  2    3. Lift items 3   4.reach behind   back / ADLs 1   5.    Score 2    Total score = sum of the activity scores/number of activities Minimum detectable change (90%CI) for average score = 2 points Minimum detectable change (90%CI) for single activity score = 3 points  COGNITION: Overall cognitive status: Within functional limits for tasks assessed     SENSATION: WFL  POSTURE: Head forward, rounded shoulder with guarding left UE  UPPER EXTREMITY ROM:   ROM Left eval  Shoulder flexion Seated A: 108* P: 113* painful end feel  Shoulder extension Seated A: 41* P:45*  Painful end feel  Shoulder abduction Seated A: 61* P:72*  Painful end feel  Shoulder adduction   Shoulder internal rotation Supine 90* abd A: 37* P:42*  Painful end feel  Shoulder external rotation Supine 45* abd A: 37* P:42*  Painful end feel  Elbow flexion   Elbow extension WFL  Wrist flexion   Wrist extension   Wrist ulnar deviation   Wrist radial deviation   Wrist pronation   Wrist supination   (Blank rows = not tested)  UPPER EXTREMITY MMT:  MMT Left eval  Shoulder flexion 4/5 Pain limiting  Shoulder extension 4/5 Pain limiting  Shoulder abduction 4/5 Pain limiting  Shoulder adduction   Shoulder internal rotation 4-/5 Pain limiting  Shoulder external rotation 4/5 Pain limiting  Middle trapezius 4/5  Lower trapezius   Elbow flexion   Elbow extension   Wrist flexion   Wrist extension   Wrist ulnar deviation   Wrist radial deviation   Wrist pronation   Wrist supination   Grip strength (lbs)   (Blank rows = not tested)  SHOULDER SPECIAL TESTS: Impingement tests: Neer impingement test: positive  and Hawkins/Kennedy impingement test: positive  Biceps assessment: Yergason's test: negative  JOINT MOBILITY TESTING:  No noted tightness with painful end feel  PALPATION:  Tenderness along anterior Glenohumeral joint, acromion and deltoid bursa.  Tightness in middle Trap & scalenes but pt denies pain     TODAY'S  TREATMENT:                                                                                                       DATE: Therex: HEP instruction/performance c cues for techniques, handout provided.  Trial set performed of each for comprehension and symptom assessment.  See below for exercise list Self-Care:  PT instructed with demo & verbal cues on positioning in bed supine and side-lying.  Patient verbalized understanding. PT educated patient on proper head / shoulder posture and correlation to shoulder problems.  Patient verbalized understanding   PATIENT EDUCATION: Education details: HEP, POC Person educated: Patient Education method: Explanation, Demonstration, Verbal cues, and Handouts Education comprehension: verbalized understanding, returned demonstration, and verbal cues required  HOME EXERCISE PROGRAM: Access Code: [email or web address] URL: [email or web address] Date: 10/21/2023 Prepared by: Lorie Rook  Exercises - Supine Chin Tuck  - 1-3 x daily - 7 x weekly - 2 sets - 10 reps - 5 seconds hold - Seated Passive Cervical Retraction  - 1-3 x daily - 7 x weekly - 2 sets - 10 reps - 5 seconds hold - Seated Scapular Retraction  - 1-3 x daily - 7 x weekly - 2 sets - 10 reps - 5 seconds hold - Isometric Shoulder Adduction  - 1-3 x daily - 7 x weekly - 2 sets - 10 reps - 5 seconds hold - Isometric Shoulder Extension at Wall  - 1-3 x daily - 7 x weekly - 2 sets - 10 reps - 5 seconds hold - Isometric Shoulder Abduction at Wall  - 1-3 x daily - 7 x weekly - 2 sets - 10 reps - 5 seconds hold - Isometric Shoulder Flexion at Wall  - 1-3 x daily - 7 x weekly - 2 sets - 10 reps - 5 seconds hold - Standing Isometric Shoulder Internal Rotation at Doorway  - 1-3 x daily - 7 x weekly - 2 sets - 10 reps - 5 seconds hold - Isometric Shoulder External Rotation at Wall  - 1-3 x daily - 7 x weekly - 2 sets - 10 reps - 5 seconds hold  ASSESSMENT:  CLINICAL IMPRESSION: Patient is a 58 y.o. who  comes to clinic with complaints of left shoulder pain with mobility, strength and movement coordination deficits that impair their ability to perform usual daily and recreational functional activities without increase difficulty/symptoms at this time.  Patient to benefit from skilled PT services to address impairments and limitations to improve to previous level of function without restriction secondary to condition.   OBJECTIVE IMPAIRMENTS: decreased activity tolerance, decreased endurance, decreased knowledge of condition, decreased ROM, decreased strength, increased edema, impaired flexibility, postural dysfunction, and pain.   ACTIVITY LIMITATIONS: carrying, lifting, sleeping, bathing, dressing, reach over head, and hygiene/grooming  PARTICIPATION LIMITATIONS: meal prep and occupation  PERSONAL FACTORS: 1-2 comorbidities: see PMH  are also affecting patient's functional outcome.   REHAB POTENTIAL: Good  CLINICAL DECISION MAKING: Stable/uncomplicated  EVALUATION COMPLEXITY: Low   GOALS: Goals reviewed with patient? Yes  SHORT TERM GOALS: (target date for Short term goals 11/12/2023)  1.Patient will demonstrate independent use of home exercise program to maintain progress from in clinic treatments. Baseline: SEE OBJECTIVE DATA Goal status: INITIAL  LONG TERM GOALS: (target dates for all long term goals 12/16/2023 )   1. Patient will demonstrate/report pain at worst less than or equal to 2/10 to facilitate minimal limitation in daily activity secondary to pain symptoms. Baseline: SEE OBJECTIVE DATA Goal status: INITIAL   2. Patient will demonstrate independent use of home exercise program to facilitate ability to maintain/progress functional gains from skilled physical therapy services. Baseline: SEE OBJECTIVE DATA Goal status: INITIAL   3.  Patient reports Patient-Specific Activity Score improved the average by 3 to indicate improvement in functional activities.  Baseline: SEE  OBJECTIVE DATA Goal status: INITIAL   4.  Patient will demonstrate left UE MMT 5/5 throughout to facilitate lifting, reaching, carrying at [facility] in daily activity.  Baseline: SEE OBJECTIVE DATA Goal status: INITIAL   5.  Patient will demonstrate left GH joint AROM WFL s symptoms to facilitate usual overhead reaching, self care, dressing at PLOF.  Baseline: SEE OBJECTIVE DATA Goal status: INITIAL      PLAN: PT FREQUENCY: 1-2x/week  PT DURATION: 8 weeks  PLANNED INTERVENTIONS: 97164- PT Re-evaluation, 97110-Therapeutic exercises, 97530- Therapeutic activity, [id number]- Neuromuscular re-education, 97535- Self Care, 16109- Manual therapy, [id number]- Electrical stimulation (unattended), [id number]- Electrical stimulation (manual), 97016- Vasopneumatic device, [id number]- Ionotophoresis 4mg /ml Dexamethasone , Patient/Family education, Taping, Dry Needling, Joint mobilization, Cryotherapy, and Moist heat  PLAN FOR NEXT SESSION:   check & update HEP.  Consider Ionto &/or dry needling for pain.     Natlie Asfour, PT, DPT 10/21/2023, 2:15 PM

## 2023-10-26 ENCOUNTER — Ambulatory Visit: Admitting: Physical Therapy

## 2023-10-26 ENCOUNTER — Encounter: Payer: Self-pay | Admitting: Physical Therapy

## 2023-10-26 DIAGNOSIS — R293 Abnormal posture: Secondary | ICD-10-CM

## 2023-10-26 DIAGNOSIS — M6281 Muscle weakness (generalized): Secondary | ICD-10-CM | POA: Diagnosis not present

## 2023-10-26 DIAGNOSIS — R600 Localized edema: Secondary | ICD-10-CM | POA: Diagnosis not present

## 2023-10-26 DIAGNOSIS — G8929 Other chronic pain: Secondary | ICD-10-CM

## 2023-10-26 DIAGNOSIS — M25512 Pain in left shoulder: Secondary | ICD-10-CM

## 2023-10-26 NOTE — Therapy (Addendum)
 OUTPATIENT PHYSICAL THERAPY UPPER EXTREMITY    Patient Name: BAYNE BUEHRER MRN: [id number] DOB:04-10-1966, 58 y.o., male Today's Date: 10/26/2023  END OF SESSION:  PT End of Session - 10/26/23 0919     Visit Number 2    Number of Visits 8    Date for PT Re-Evaluation 12/16/23    Authorization Type [employer] PPO    Authorization Time Period $30 copay    PT Start Time 0820    PT Stop Time 0915    PT Time Calculation (min) 55 min    Activity Tolerance Patient tolerated treatment well;Patient limited by pain    Behavior During Therapy WFL for tasks assessed/performed              Past Medical History:  Diagnosis Date   Anemia    Arthritis    GERD (gastroesophageal reflux disease)    chronic indigestion   Hypertension    Pre-diabetes    Past Surgical History:  Procedure Laterality Date   ADENOIDECTOMY     FINGER AMPUTATION     finger repair after partial amp. -- right middle finger   TONSILLECTOMY     TOTAL HIP ARTHROPLASTY Left 09/27/2020   Procedure: LEFT TOTAL HIP ARTHROPLASTY ANTERIOR APPROACH;  Surgeon: Arnie Lao, MD;  Location: [facility];  Service: Orthopedics;  Laterality: Left;   Patient Active Problem List   Diagnosis Date Noted   Status post left hip replacement 09/27/2020   Unilateral primary osteoarthritis, left hip 07/06/2019   Obesity 05/08/2014   DOE (dyspnea on exertion) 03/10/2013   OSA (obstructive sleep apnea) 03/10/2013    PCP: Allene Ivan, MD  REFERRING PROVIDER: Arnie Lao*  REFERRING DIAG: [phone] (ICD-10-CM) - Chronic left shoulder pain   THERAPY DIAG:  Chronic left shoulder pain Muscle weakness (generalized) Stiffness of left shoulder, not elsewhere classified Abnormal posture Localized edema   Rationale for Evaluation and Treatment: Rehabilitation  ONSET DATE: 09/30/2023  MD office visit with PT referral  SUBJECTIVE:                                                                                                                                                                                       SUBJECTIVE STATEMENT: Pt arriving today reporting 4/10 pain with end range Left Shoulder flexion and IR behind his back. Pt stating his ROM has improved since beginning his exercises.     Eval:  This 58yo male saw Dr. Lucienne Ryder 09/30/23 with 6 month history of left shoulder pain and decreased range. Patient denies injury.  He had positive Neer & Hawkins signs.  X-ray showed no acute findings but decrease in  the subacromial outlet. He got Cortizone shot with some improvement. Hand dominance: Left  PERTINENT HISTORY: Left THA 09/27/20, OA, gout, HTN, obesity  PAIN:  Are you having pain? Yes: NPRS scale: no pain at rest 4/10 and movement no resistance 6-7/10 in front but 10/10 reaching behind.  Lifting weight away from body hurts.  Pain location: left shoulder joint line & deltoid bursa Pain description: at rest ache, with movement no resistance sharper the higher he goes, weight makes faster Aggravating factors: moving arm especially behind him or overhead  Relieving factors: moves in good position (against chest)   PRECAUTIONS: None  RED FLAGS: None   WEIGHT BEARING RESTRICTIONS: No  FALLS:  Has patient fallen in last 6 months? 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  DIAGNOSTIC FINDINGS:  09/30/23 X-ray 3 views of the left shoulder show no acute findings.  There is decrease in the subacromial outlet.  Patient-Specific Activity Scoring Scheme  "0" represents "unable to perform." "10" represents "able to perform at prior  level. 0 1 2 3 4 5 6 7 8 9  10 (Date and Score)   Activity Eval     1. Lift arm overhead 2     2. Push up from floor / dog care  2    3. Lift items 3   4.reach behind  back / ADLs 1   5.    Score 2    Total score = sum of the activity scores/number of activities Minimum detectable change (90%CI) for average score = 2 points Minimum detectable change (90%CI) for single activity score = 3 points  COGNITION: Overall cognitive status: Within functional limits for tasks assessed     SENSATION: WFL  POSTURE: Head forward, rounded shoulder with guarding left UE  UPPER EXTREMITY ROM:   ROM Left eval Left 10/26/23 supine  Shoulder flexion Seated A: 108* P: 113* painful end feel AA: 148  Shoulder extension Seated A: 41* P:45*  Painful end feel   Shoulder abduction Seated A: 61* P:72*  Painful end feel   Shoulder adduction    Shoulder internal rotation Supine 90* abd A: 37* P:42*  Painful end feel   Shoulder external rotation Supine 45* abd A: 37* P:42*  Painful end feel   Elbow flexion    Elbow extension WFL   Wrist flexion    Wrist extension    Wrist ulnar deviation    Wrist radial deviation    Wrist pronation    Wrist supination    (Blank rows = not tested)  UPPER EXTREMITY MMT:  MMT Left eval  Shoulder flexion 4/5 Pain limiting  Shoulder extension 4/5 Pain limiting  Shoulder abduction 4/5 Pain limiting  Shoulder adduction   Shoulder internal rotation 4-/5 Pain limiting  Shoulder external rotation 4/5 Pain limiting  Middle trapezius 4/5  Lower trapezius   Elbow flexion   Elbow extension   Wrist flexion   Wrist extension   Wrist ulnar deviation   Wrist radial deviation   Wrist pronation   Wrist supination   Grip strength (lbs)   (Blank rows = not tested)  SHOULDER SPECIAL TESTS: Impingement tests: Neer impingement  test: positive  and Hawkins/Kennedy impingement test: positive  Biceps assessment: Yergason's test: negative  JOINT  MOBILITY TESTING:  No noted tightness with painful end feel  PALPATION:  Tenderness along anterior Glenohumeral joint, acromion and deltoid bursa.  Tightness in middle Trap & scalenes but pt denies pain     TODAY'S TREATMENT:                                                                                                       DATE: 10/26/23:  TherEx Pulleys: flexion x 2 minutes, scaption  2 minutes Resistive left shoulder ER green TB x 10 (walk outs) Left shoulder IR 2 x 10  Supine shoulder flexion 2 x 10 2# bar, holding end range 2-3 seconds Adduction stretch: cross arm x 2 holding 30 sec IR stretch with towell x 4 holding 30 sec  Neuro Re-Ed Supine shoulder stabilization circles clockwise and CCW x 10 each , shoulder at 90 degrees  TherAct Rows: blue TB 2 x 15 holding 3 sec scapular squeeze  Self Care Instructions in Self Care following DN, handout issued and pt reporting understandin  Manual Skilled palpation performed of active trigger point release during TPDN Trigger Point Dry Needling Initial Treatment: Pt instructed on Dry Needling rational, procedures, and possible side effects. Pt instructed to expect mild to moderate muscle soreness later in the day and/or into the next day.  Pt instructed in methods to reduce muscle soreness. Pt instructed to continue prescribed HEP. Patient was educated on signs and symptoms of infection and other risk factors and advised to seek medical attention should they occur.  Patient verbalized understanding of these instructions and education.  Patient Verbal Consent Given: Yes Education Handout Provided: Yes Muscles treated: anterior and middle deltoid left shoulder Treatment response/outcome: local twitch response     Eval Therex: HEP instruction/performance c cues for techniques, handout provided.  Trial set performed of each for comprehension and symptom assessment.  See below for exercise list Self-Care:  PT instructed with demo  & verbal cues on positioning in bed supine and side-lying.  Patient verbalized understanding. PT educated patient on proper head / shoulder posture and correlation to shoulder problems.  Patient verbalized understanding   PATIENT EDUCATION: Education details: HEP, POC, DN handout issued on 10/26/23 Person educated: Patient Education method: Explanation, Demonstration, Verbal cues, and Handouts Education comprehension: verbalized understanding, returned demonstration, and verbal cues required  HOME EXERCISE PROGRAM: Access Code: [email or web address] URL: [email or web address] Date: 10/26/2023 Prepared by: Jerrel Mor  Exercises - Supine Chin Tuck  - 1-3 x daily - 7 x weekly - 2 sets - 10 reps - 5 seconds hold - Seated Passive Cervical Retraction  - 1-3 x daily - 7 x weekly - 2 sets - 10 reps - 5 seconds hold - Isometric Shoulder Extension at Wall  - 1-3 x daily - 7 x weekly - 2 sets - 10 reps - 5 seconds hold - Isometric Shoulder Flexion at Wall  - 1-3 x daily - 7 x weekly - 2 sets - 10 reps - 5 seconds hold -  Standing Isometric Shoulder Internal Rotation at Doorway  - 1-3 x daily - 7 x weekly - 2 sets - 10 reps - 5 seconds hold - Isometric Shoulder External Rotation at Wall  - 1-3 x daily - 7 x weekly - 2 sets - 10 reps - 5 seconds hold - Standing Row with Anchored Resistance  - 1-2 x daily - 7 x weekly - 2 sets - 10 reps - 3 seconds hold - Shoulder External Rotation Reactive Isometrics  - 1-2 x daily - 7 x weekly - 10 reps - Shoulder Internal Rotation Reactive Isometrics  - 1-2 x daily - 7 x weekly - 10 reps - Doorway Pec Stretch at 60 Elevation  - 1-2 x daily - 7 x weekly - 3 reps - 20 seconds hold  ASSESSMENT:  CLINICAL IMPRESSION: Pt tolerating exercises well with improvements noted in ROM from pt's initial evaluation. We added more resistive exercises to pts HEP and pt able to demonstrate independence. Pt agreeing to TPDN to anterior and middle deltoid. Pt with good tolerance  during treatment. Edu and Self Care provided along with handout. Continue skilled PT treatment plan.  OBJECTIVE IMPAIRMENTS: decreased activity tolerance, decreased endurance, decreased knowledge of condition, decreased ROM, decreased strength, increased edema, impaired flexibility, postural dysfunction, and pain.   ACTIVITY LIMITATIONS: carrying, lifting, sleeping, bathing, dressing, reach over head, and hygiene/grooming  PARTICIPATION LIMITATIONS: meal prep and occupation  PERSONAL FACTORS: 1-2 comorbidities: see PMH  are also affecting patient's functional outcome.   REHAB POTENTIAL: Good  CLINICAL DECISION MAKING: Stable/uncomplicated  EVALUATION COMPLEXITY: Low   GOALS: Goals reviewed with patient? Yes  SHORT TERM GOALS: (target date for Short term goals 11/12/2023)  1.Patient will demonstrate independent use of home exercise program to maintain progress from in clinic treatments. Baseline: SEE OBJECTIVE DATA Goal status: On-going 10/26/23  LONG TERM GOALS: (target dates for all long term goals 12/16/2023 )   1. Patient will demonstrate/report pain at worst less than or equal to 2/10 to facilitate minimal limitation in daily activity secondary to pain symptoms. Baseline: SEE OBJECTIVE DATA Goal status: INITIAL   2. Patient will demonstrate independent use of home exercise program to facilitate ability to maintain/progress functional gains from skilled physical therapy services. Baseline: SEE OBJECTIVE DATA Goal status: INITIAL   3.  Patient reports Patient-Specific Activity Score improved the average by 3 to indicate improvement in functional activities.  Baseline: SEE OBJECTIVE DATA Goal status: INITIAL   4.  Patient will demonstrate left UE MMT 5/5 throughout to facilitate lifting, reaching, carrying at [facility] in daily activity.  Baseline: SEE OBJECTIVE DATA Goal status: INITIAL   5.  Patient will demonstrate left GH joint AROM WFL s symptoms to facilitate usual overhead  reaching, self care, dressing at PLOF.  Baseline: SEE OBJECTIVE DATA Goal status: INITIAL      PLAN: PT FREQUENCY: 1-2x/week  PT DURATION: 8 weeks  PLANNED INTERVENTIONS: 97164- PT Re-evaluation, 97110-Therapeutic exercises, 97530- Therapeutic activity, [id number]- Neuromuscular re-education, 97535- Self Care, 64403- Manual therapy, [id number]- Electrical stimulation (unattended), [phone]- Electrical stimulation (manual), 97016- Vasopneumatic device, [id number]- Ionotophoresis 4mg /ml Dexamethasone , Patient/Family education, Taping, Dry Needling, Joint mobilization, Cryotherapy, and Moist heat  PLAN FOR NEXT SESSION:   shoulder ROM, shoulder mobs,  Assess response to DN   Marysue Sola, PT, MPT 10/26/2023, 9:23 AM

## 2023-10-26 NOTE — Patient Instructions (Signed)

## 2023-11-02 ENCOUNTER — Ambulatory Visit: Admitting: Physical Therapy

## 2023-11-02 ENCOUNTER — Encounter: Payer: Self-pay | Admitting: Physical Therapy

## 2023-11-02 DIAGNOSIS — R293 Abnormal posture: Secondary | ICD-10-CM | POA: Diagnosis not present

## 2023-11-02 DIAGNOSIS — M25512 Pain in left shoulder: Secondary | ICD-10-CM | POA: Diagnosis not present

## 2023-11-02 DIAGNOSIS — G8929 Other chronic pain: Secondary | ICD-10-CM

## 2023-11-02 DIAGNOSIS — R600 Localized edema: Secondary | ICD-10-CM | POA: Diagnosis not present

## 2023-11-02 DIAGNOSIS — M25612 Stiffness of left shoulder, not elsewhere classified: Secondary | ICD-10-CM

## 2023-11-02 DIAGNOSIS — M6281 Muscle weakness (generalized): Secondary | ICD-10-CM | POA: Diagnosis not present

## 2023-11-02 NOTE — Therapy (Signed)
 OUTPATIENT PHYSICAL THERAPY UPPER EXTREMITY    Patient Name: George Hays MRN: [id number] DOB:[birth year], 58 y.o., male Today's Date: 11/02/2023  END OF SESSION:  PT End of Session - 11/02/23 0841     Visit Number 3    Number of Visits 8    Date for PT Re-Evaluation 12/16/23    Authorization Type [employer] PPO    PT Start Time 0801    PT Stop Time 0839    PT Time Calculation (min) 38 min    Activity Tolerance Patient tolerated treatment well;Patient limited by pain    Behavior During Therapy WFL for tasks assessed/performed               Past Medical History:  Diagnosis Date   Anemia    Arthritis    GERD (gastroesophageal reflux disease)    chronic indigestion   Hypertension    Pre-diabetes    Past Surgical History:  Procedure Laterality Date   ADENOIDECTOMY     FINGER AMPUTATION     finger repair after partial amp. -- right middle finger   TONSILLECTOMY     TOTAL HIP ARTHROPLASTY Left 09/27/2020   Procedure: LEFT TOTAL HIP ARTHROPLASTY ANTERIOR APPROACH;  Surgeon: Arnie Lao, MD;  Location: [facility];  Service: Orthopedics;  Laterality: Left;   Patient Active Problem List   Diagnosis Date Noted   Status post left hip replacement 09/27/2020   Unilateral primary osteoarthritis, left hip 07/06/2019   Obesity 05/08/2014   DOE (dyspnea on exertion) 03/10/2013   OSA (obstructive sleep apnea) 03/10/2013    PCP: Allene Ivan, MD  REFERRING PROVIDER: Arnie Lao*  REFERRING DIAG: [phone] (ICD-10-CM) - Chronic left shoulder pain   THERAPY DIAG:  Chronic left shoulder pain Muscle weakness (generalized) Stiffness of left shoulder, not elsewhere classified Abnormal posture Localized edema   Rationale for Evaluation and Treatment: Rehabilitation  ONSET DATE: 09/30/2023  MD office visit with PT referral  SUBJECTIVE:                                                                                                                                                                                       SUBJECTIVE STATEMENT: Pt stating he feels like his ROM has improved and the DN helped last visit.     Eval:  This 58yo male saw Dr. Lucienne Ryder 09/30/23 with 6 month history of left shoulder pain and decreased range. Patient denies injury.  He had positive Neer & Hawkins signs.  X-ray showed no acute findings but decrease in the subacromial outlet. He got Cortizone shot with some improvement. Hand dominance: Left  PERTINENT HISTORY: Left THA 09/27/20,  OA, gout, HTN, obesity  PAIN:  Are you having pain? Yes: NPRS scale: no pain at rest,  when reaching can reach 5-6/10 reaching behind.  Lifting weight away from body hurts.  Pain location: left shoulder joint line & deltoid bursa Pain description: at rest ache, with movement no resistance sharper the higher he goes, weight makes faster Aggravating factors: moving arm especially behind him or overhead  Relieving factors: moves in good position (against chest)   PRECAUTIONS: None  RED FLAGS: None   WEIGHT BEARING RESTRICTIONS: No  FALLS:  Has patient fallen in last 6 months? 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  DIAGNOSTIC FINDINGS:  09/30/23 X-ray 3 views of the left shoulder show no acute findings.  There is decrease in the subacromial outlet.  Patient-Specific Activity Scoring Scheme  "0" represents "unable to perform." "10" represents "able to perform at prior level. 0 1 2 3 4 5 6 7 8 9  10 (Date and Score)   Activity Eval     1. Lift arm overhead 2     2. Push up from floor / dog care  2    3. Lift  items 3   4.reach behind  back / ADLs 1   5.    Score 2    Total score = sum of the activity scores/number of activities Minimum detectable change (90%CI) for average score = 2 points Minimum detectable change (90%CI) for single activity score = 3 points  COGNITION: Overall cognitive status: Within functional limits for tasks assessed     SENSATION: WFL  POSTURE: Head forward, rounded shoulder with guarding left UE  UPPER EXTREMITY ROM:   ROM Left eval Left 10/26/23 supine Left 11/02/23  Shoulder flexion Seated A: 108* P: 113* painful end feel AA: 148   Shoulder extension Seated A: 41* P:45*  Painful end feel    Shoulder abduction Seated A: 61* P:72*  Painful end feel    Shoulder adduction     Shoulder internal rotation Supine 90* abd A: 37* P:42*  Painful end feel  Supine shoulder Abd 90 A: 72  Shoulder external rotation Supine 45* abd A: 37* P:42*  Painful end feel   Supine shoulder abd 90 A: 58  Elbow flexion     Elbow extension WFL    Wrist flexion     Wrist extension     Wrist ulnar deviation     Wrist radial deviation     Wrist pronation     Wrist supination     (Blank rows = not tested)  UPPER EXTREMITY MMT:  MMT Left eval  Shoulder flexion 4/5 Pain limiting  Shoulder extension 4/5 Pain limiting  Shoulder abduction 4/5 Pain limiting  Shoulder adduction   Shoulder internal rotation 4-/5 Pain limiting  Shoulder external rotation 4/5 Pain limiting  Middle trapezius 4/5  Lower trapezius   Elbow flexion   Elbow extension   Wrist flexion   Wrist extension   Wrist ulnar deviation   Wrist radial deviation   Wrist pronation   Wrist supination   Grip strength (lbs)   (Blank rows = not tested)  SHOULDER SPECIAL TESTS:  Impingement tests: Neer impingement test: positive  and Hawkins/Kennedy impingement test: positive  Biceps assessment: Yergason's test: negative  JOINT MOBILITY TESTING:  No noted tightness with painful end  feel  PALPATION:  Tenderness along anterior Glenohumeral joint, acromion and deltoid bursa.  Tightness in middle Trap & scalenes but pt denies pain     TODAY'S TREATMENT:                                                                                                       DATE: TherEx UBE: level 3 x 2 minutes each direction Standing shoulder abd: 2 # 2 x 10  Sitting: cross body stretch: x 3 holding 20 sec Sitting: IR of left shoulder behind back, clasping hands along lumbar region and holding 20 sec x 2   TherAct Rows: blue TB 2 x 15 holding 3 sec  Shoulder extension blue TB 2 x 15 holding 3 sec Shoulder ER: green TB 2 x 15 holding 3 sec  Manual:  Trigger Point Dry Needling Skilled palpation of active trigger points during TPDN Subsequent Treatment: Instructions provided previously at initial dry needling treatment.  Instructions reviewed, if requested by the patient, prior to subsequent dry needling treatment.  Patient Verbal Consent Given: Yes Education Handout Provided: Previously Provided Muscles treated: anterior, middle deltoid on the left Treatment response/outcome: local twitch response   10/26/23:  TherEx Pulleys: flexion x 2 minutes, scaption  2 minutes Resistive left shoulder ER green TB x 10 (walk outs) Left shoulder IR 2 x 10  Supine shoulder flexion 2 x 10 2# bar, holding end range 2-3 seconds Adduction stretch: cross arm x 2 holding 30 sec IR stretch with towell x 4 holding 30 sec  Neuro Re-Ed Supine shoulder stabilization circles clockwise and CCW x 10 each , shoulder at 90 degrees  TherAct Rows: blue TB 2 x 15 holding 3 sec scapular squeeze  Self Care Instructions in Self Care following DN, handout issued and pt reporting understandin  Manual Skilled palpation performed of active trigger point release during TPDN Trigger Point Dry Needling Initial Treatment: Pt instructed on Dry Needling rational, procedures, and possible side effects. Pt instructed  to expect mild to moderate muscle soreness later in the day and/or into the next day.  Pt instructed in methods to reduce muscle soreness. Pt instructed to continue prescribed HEP. Patient was educated on signs and symptoms of infection and other risk factors and advised to seek medical attention should they occur.  Patient verbalized understanding of these instructions and education.  Patient Verbal Consent Given: Yes Education Handout Provided: Yes Muscles treated: anterior and middle deltoid left shoulder Treatment response/outcome: local twitch response     Eval Therex: HEP instruction/performance c cues for techniques, handout provided.  Trial set performed of each for comprehension and symptom assessment.  See below for exercise list Self-Care:  PT instructed with demo & verbal cues on positioning in bed supine and side-lying.  Patient verbalized understanding. PT educated patient on proper head / shoulder posture and correlation to shoulder problems.  Patient verbalized understanding  PATIENT EDUCATION: Education details: HEP, POC, DN handout issued on 10/26/23 Person educated: Patient Education method: Explanation, Demonstration, Verbal cues, and Handouts Education comprehension: verbalized understanding, returned demonstration, and verbal cues required  HOME EXERCISE PROGRAM: Access Code: [email or web address] URL: [email or web address] Date: 10/26/2023 Prepared by: Jerrel Mor  Exercises - Supine Chin Tuck  - 1-3 x daily - 7 x weekly - 2 sets - 10 reps - 5 seconds hold - Seated Passive Cervical Retraction  - 1-3 x daily - 7 x weekly - 2 sets - 10 reps - 5 seconds hold - Isometric Shoulder Extension at Wall  - 1-3 x daily - 7 x weekly - 2 sets - 10 reps - 5 seconds hold - Isometric Shoulder Flexion at Wall  - 1-3 x daily - 7 x weekly - 2 sets - 10 reps - 5 seconds hold - Standing Isometric Shoulder Internal Rotation at Doorway  - 1-3 x daily - 7 x weekly - 2 sets -  10 reps - 5 seconds hold - Isometric Shoulder External Rotation at Wall  - 1-3 x daily - 7 x weekly - 2 sets - 10 reps - 5 seconds hold - Standing Row with Anchored Resistance  - 1-2 x daily - 7 x weekly - 2 sets - 10 reps - 3 seconds hold - Shoulder External Rotation Reactive Isometrics  - 1-2 x daily - 7 x weekly - 10 reps - Shoulder Internal Rotation Reactive Isometrics  - 1-2 x daily - 7 x weekly - 10 reps - Doorway Pec Stretch at 60 Elevation  - 1-2 x daily - 7 x weekly - 3 reps - 20 seconds hold  ASSESSMENT:  CLINICAL IMPRESSION: Pt reporting good response to DN last visit and wishing to try it again. Pt also with improvements in ROM from his initial evaluation.  Pt agreeing to TPDN to anterior and middle deltoid again this visit. Pt with good tolerance during treatment. Continue skilled PT treatment plan.  OBJECTIVE IMPAIRMENTS: decreased activity tolerance, decreased endurance, decreased knowledge of condition, decreased ROM, decreased strength, increased edema, impaired flexibility, postural dysfunction, and pain.   ACTIVITY LIMITATIONS: carrying, lifting, sleeping, bathing, dressing, reach over head, and hygiene/grooming  PARTICIPATION LIMITATIONS: meal prep and occupation  PERSONAL FACTORS: 1-2 comorbidities: see PMH are also affecting patient's functional outcome.   REHAB POTENTIAL: Good  CLINICAL DECISION MAKING: Stable/uncomplicated  EVALUATION COMPLEXITY: Low   GOALS: Goals reviewed with patient? Yes  SHORT TERM GOALS: (target date for Short term goals 11/12/2023)  1.Patient will demonstrate independent use of home exercise program to maintain progress from in clinic treatments. Baseline: SEE OBJECTIVE DATA Goal status: On-going 10/26/23  LONG TERM GOALS: (target dates for all long term goals 12/16/2023 )   1. Patient will demonstrate/report pain at worst less than or equal to 2/10 to facilitate minimal limitation in daily activity secondary to pain  symptoms. Baseline: SEE OBJECTIVE DATA Goal status: INITIAL   2. Patient will demonstrate independent use of home exercise program to facilitate ability to maintain/progress functional gains from skilled physical therapy services. Baseline: SEE OBJECTIVE DATA Goal status: INITIAL   3.  Patient reports Patient-Specific Activity Score improved the average by 3 to indicate improvement in functional activities.  Baseline: SEE OBJECTIVE DATA Goal status: INITIAL   4.  Patient will demonstrate left UE MMT 5/5 throughout to facilitate lifting, reaching, carrying at [facility] in daily activity.  Baseline: SEE OBJECTIVE DATA Goal status: INITIAL   5.  Patient will demonstrate left GH joint AROM  WFL s symptoms to facilitate usual overhead reaching, self care, dressing at PLOF.  Baseline: SEE OBJECTIVE DATA Goal status: INITIAL      PLAN: PT FREQUENCY: 1-2x/week  PT DURATION: 8 weeks  PLANNED INTERVENTIONS: 97164- PT Re-evaluation, 97110-Therapeutic exercises, 97530- Therapeutic activity, [id number]- Neuromuscular re-education, 97535- Self Care, 36644- Manual therapy, G0283- Electrical stimulation (unattended), [phone]- Electrical stimulation (manual), 97016- Vasopneumatic device, [id number]- Ionotophoresis 4mg /ml Dexamethasone , Patient/Family education, Taping, Dry Needling, Joint mobilization, Cryotherapy, and Moist heat  PLAN FOR NEXT SESSION:   shoulder ROM, shoulder mobs,  Assess response to DN   Marysue Sola, PT, MPT 11/02/2023, 8:42 AM

## 2023-11-04 ENCOUNTER — Ambulatory Visit: Admitting: Orthopaedic Surgery

## 2023-11-04 DIAGNOSIS — M25512 Pain in left shoulder: Secondary | ICD-10-CM

## 2023-11-04 DIAGNOSIS — G8929 Other chronic pain: Secondary | ICD-10-CM

## 2023-11-04 NOTE — Progress Notes (Signed)
 The patient is following up with left shoulder impingement syndrome after going through physical therapy.  We did inject her shoulder on 10 April.  He says he is making progress with his range of motion and pain.  On exam his forward flexion abduction is full.  He still having some pain with reaching behind him but the range of motion is improved on the left side reaching behind him and his rotator cuff feels strong.  He will continue outpatient medical therapy.  He is making definitely progress.  Will see him back in 6 weeks and if he still having some residual pain I would absolutely consider another steroid injection at that visit before proceeding with a surgical intervention and he agrees with that as well.  All question concerns were addressed and answered.

## 2023-11-05 ENCOUNTER — Encounter: Payer: Self-pay | Admitting: Rehabilitative and Restorative Service Providers"

## 2023-11-05 ENCOUNTER — Ambulatory Visit: Admitting: Rehabilitative and Restorative Service Providers"

## 2023-11-05 DIAGNOSIS — M6281 Muscle weakness (generalized): Secondary | ICD-10-CM

## 2023-11-05 DIAGNOSIS — R293 Abnormal posture: Secondary | ICD-10-CM | POA: Diagnosis not present

## 2023-11-05 DIAGNOSIS — M25612 Stiffness of left shoulder, not elsewhere classified: Secondary | ICD-10-CM

## 2023-11-05 DIAGNOSIS — M25512 Pain in left shoulder: Secondary | ICD-10-CM

## 2023-11-05 DIAGNOSIS — R600 Localized edema: Secondary | ICD-10-CM

## 2023-11-05 DIAGNOSIS — G8929 Other chronic pain: Secondary | ICD-10-CM

## 2023-11-05 NOTE — Therapy (Signed)
 OUTPATIENT PHYSICAL THERAPY TREATMENT   Patient Name: George Hays MRN: [id number] DOB:06/05/1966, 58 y.o., male Today's Date: 11/05/2023  END OF SESSION:  PT End of Session - 11/05/23 0823     Visit Number 4    Number of Visits 8    Date for PT Re-Evaluation 12/16/23    Authorization Type [employer] PPO    PT Start Time 0801    PT Stop Time 0840    PT Time Calculation (min) 39 min    Activity Tolerance Patient tolerated treatment well    Behavior During Therapy WFL for tasks assessed/performed                Past Medical History:  Diagnosis Date   Anemia    Arthritis    GERD (gastroesophageal reflux disease)    chronic indigestion   Hypertension    Pre-diabetes    Past Surgical History:  Procedure Laterality Date   ADENOIDECTOMY     FINGER AMPUTATION     finger repair after partial amp. -- right middle finger   TONSILLECTOMY     TOTAL HIP ARTHROPLASTY Left 09/27/2020   Procedure: LEFT TOTAL HIP ARTHROPLASTY ANTERIOR APPROACH;  Surgeon: George Lao, MD;  Location: [facility];  Service: Orthopedics;  Laterality: Left;   Patient Active Problem List   Diagnosis Date Noted   Unilateral primary osteoarthritis, left hip 07/06/2019   Obesity 05/08/2014   DOE (dyspnea on exertion) 03/10/2013   OSA (obstructive sleep apnea) 03/10/2013    PCP: George Ivan, MD  REFERRING PROVIDER: Arnie Hays*  REFERRING DIAG: [phone] (ICD-10-CM) - Chronic left shoulder pain   THERAPY DIAG:  Chronic left shoulder pain Muscle weakness (generalized) Stiffness of left shoulder, not elsewhere classified Abnormal posture Localized edema   Rationale for Evaluation and Treatment: Rehabilitation  ONSET DATE: 09/30/2023  MD office visit with PT referral  SUBJECTIVE:                                                                                                                                                                                       SUBJECTIVE STATEMENT: Pt indicated continued improvement in elevation mobility.  Hand behind back still tight/pain.  Reported approx. 70% improvement to normal at this time.    Hand dominance: Left  PERTINENT HISTORY: Left THA 09/27/20, OA, gout, HTN, obesity  PAIN:  NPRS scale: no pain at rest.  Pain location: Lt anterior shoulder Pain description: tightness, pinch pain Aggravating factors: outstretched reaching Relieving factors: moves in good position (against chest)   PRECAUTIONS: None  RED FLAGS: None   WEIGHT BEARING RESTRICTIONS: No  FALLS:  Has patient  fallen in last 6 months? 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  DIAGNOSTIC FINDINGS:  09/30/23 X-ray 3 views of the left shoulder show no acute findings.  There is decrease in the subacromial outlet.  Patient-Specific Activity Scoring Scheme  "0" represents "unable to perform." "10" represents "able to perform at prior level. 0 1 2 3 4 5 6 7 8 9  10 (Date and Score)   Activity Eval  10/21/2023    1. Lift arm overhead 2     2. Push up from floor / dog care  2    3. Lift items 3   4.reach behind  back / ADLs 1   5.    Score 2    Total score = sum of the activity scores/number of activities Minimum detectable change (90%CI) for average score = 2 points Minimum detectable change (90%CI) for single activity score = 3 points  COGNITION: Overall cognitive status: Within functional limits for tasks assessed     SENSATION: 10/21/2023 George Hays  POSTURE: 10/21/2023 Head forward, rounded shoulder with guarding left UE  UPPER EXTREMITY ROM:   ROM  Left Eval 10/21/2023 Left 10/26/23 supine Left 11/02/23 Left 11/05/2023  Shoulder flexion Seated A: 108* P: 113* painful end feel AA: 148    Shoulder extension Seated A: 41* P:45*  Painful end feel     Shoulder abduction Seated A: 61* P:72*  Painful end feel     Shoulder adduction      Shoulder internal rotation Supine 90* abd A: 37* P:42*  Painful end feel  Supine shoulder Abd 90 A: 72 Supine 90* abd AROM 76   Shoulder external rotation Supine 45* abd A: 37* P:42*  Painful end feel   Supine shoulder abd 90 A: 58   Elbow flexion      Elbow extension WFL     Wrist flexion      Wrist extension      Wrist ulnar deviation      Wrist radial deviation      Wrist pronation      Wrist supination      (Blank rows = not tested)  UPPER EXTREMITY MMT:  MMT Left Eval 10/21/2023 Left 11/05/2023   Shoulder flexion 4/5 Pain limiting 5/5  Shoulder extension 4/5 Pain limiting   Shoulder abduction 4/5 Pain limiting 5/5  Shoulder adduction    Shoulder internal rotation 4-/5 Pain limiting 5/5  Shoulder external rotation 4/5 Pain limiting 5/5  Middle trapezius 4/5   Lower trapezius    Elbow flexion    Elbow extension    Wrist flexion    Wrist extension    Wrist ulnar deviation    Wrist radial deviation    Wrist pronation    Wrist supination    Grip strength (lbs)    (Blank rows = not tested)  SHOULDER SPECIAL TESTS: 10/21/2023 Impingement tests: Neer impingement test: positive  and Hawkins/Kennedy impingement test: positive  Biceps assessment: Yergason's test: negative  JOINT MOBILITY TESTING:  10/21/2023 No noted tightness with painful end feel  PALPATION:  10/21/2023 Tenderness along anterior Glenohumeral joint, acromion and deltoid bursa.  Tightness in middle Trap &  scalenes but pt denies pain                    TODAY'S TREATMENT:                                                                                    DATE: 11/05/2023 Therex: Review of posterior capsule  stretching with discussion and education on cross arm stretch, sleeper stretch and IR rope stretch with emphasis on 30 sec holds UBE fwd/back 3 mins each way lvl 3.5  Neuro Re-ed (muscle recruitment, postural activation, scapular control) Green tband ER c flexion punch Lt arm 2 x 10 Wall push up with SA press 3 sec hold x 15 Standing 2 lb ball wall 90 deg flexion small circles with outstretched arm 2 x30 seconds cw, ccw each   Manual: Percussive device to Lt infraspinatus , middle trap with trigger point STM   TODAY'S TREATMENT:                                                                                    DATE: 11/02/2023 TherEx UBE: level 3 x 2 minutes each direction Standing shoulder abd: 2 # 2 x 10  Sitting: cross body stretch: x 3 holding 20 sec Sitting: IR of left shoulder behind back, clasping hands along lumbar region and holding 20 sec x 2   TherAct Rows: blue TB 2 x 15 holding 3 sec  Shoulder extension blue TB 2 x 15 holding 3 sec Shoulder ER: green TB 2 x 15 holding 3 sec  Manual:  Trigger Point Dry Needling Skilled palpation of active trigger points during TPDN Subsequent Treatment: Instructions provided previously at initial dry needling treatment.  Instructions reviewed, if requested by the patient, prior to subsequent dry needling treatment.  Patient Verbal Consent Given: Yes Education Handout Provided: Previously Provided Muscles treated: anterior, middle deltoid on the left Treatment response/outcome: local twitch response   TODAY'S TREATMENT:                                                                                    DATE:10/26/23:  TherEx Pulleys: flexion x 2 minutes, scaption  2 minutes Resistive left shoulder ER green TB x 10 (walk outs) Left shoulder IR 2 x 10  Supine shoulder flexion 2 x 10 2# bar, holding end range 2-3 seconds Adduction stretch: cross arm x 2 holding 30 sec IR stretch with towell x 4 holding 30 sec  Neuro Re-Ed Supine  shoulder stabilization circles clockwise and CCW x  10 each , shoulder at 90 degrees  TherAct Rows: blue TB 2 x 15 holding 3 sec scapular squeeze  Self Care Instructions in Self Care following DN, handout issued and pt reporting understandin  Manual Skilled palpation performed of active trigger point release during TPDN Trigger Point Dry Needling Initial Treatment: Pt instructed on Dry Needling rational, procedures, and possible side effects. Pt instructed to expect mild to moderate muscle soreness later in the day and/or into the next day.  Pt instructed in methods to reduce muscle soreness. Pt instructed to continue prescribed HEP. Patient was educated on signs and symptoms of infection and other risk factors and advised to seek medical attention should they occur.  Patient verbalized understanding of these instructions and education.  Patient Verbal Consent Given: Yes Education Handout Provided: Yes Muscles treated: anterior and middle deltoid left shoulder Treatment response/outcome: local twitch response     PATIENT EDUCATION: Education details: HEP, POC, DN handout issued on 10/26/23 Person educated: Patient Education method: Explanation, Demonstration, Verbal cues, and Handouts Education comprehension: verbalized understanding, returned demonstration, and verbal cues required  HOME EXERCISE PROGRAM: Access Code: [email or web address] URL: [email or web address] Date: 10/26/2023 Prepared by: Jerrel Mor  Exercises - Supine Chin Tuck  - 1-3 x daily - 7 x weekly - 2 sets - 10 reps - 5 seconds hold - Seated Passive Cervical Retraction  - 1-3 x daily - 7 x weekly - 2 sets - 10 reps - 5 seconds hold - Isometric Shoulder Extension at Wall  - 1-3 x daily - 7 x weekly - 2 sets - 10 reps - 5 seconds hold - Isometric Shoulder Flexion at Wall  - 1-3 x daily - 7 x weekly - 2 sets - 10 reps - 5 seconds hold - Standing Isometric Shoulder Internal Rotation at Doorway  - 1-3 x daily  - 7 x weekly - 2 sets - 10 reps - 5 seconds hold - Isometric Shoulder External Rotation at Wall  - 1-3 x daily - 7 x weekly - 2 sets - 10 reps - 5 seconds hold - Standing Row with Anchored Resistance  - 1-2 x daily - 7 x weekly - 2 sets - 10 reps - 3 seconds hold - Shoulder External Rotation Reactive Isometrics  - 1-2 x daily - 7 x weekly - 10 reps - Shoulder Internal Rotation Reactive Isometrics  - 1-2 x daily - 7 x weekly - 10 reps - Doorway Pec Stretch at 60 Elevation  - 1-2 x daily - 7 x weekly - 3 reps - 20 seconds hold  ASSESSMENT:  CLINICAL IMPRESSION: Reassessment of strength of Lt shoulder showed good gains today with no pain noted in testing.  Hand behind back movement improved by several inches after manual intervention today to infraspinatus, posterior shoulder tightness.   Use of needling again in future as symptoms indicated.    OBJECTIVE IMPAIRMENTS: decreased activity tolerance, decreased endurance, decreased knowledge of condition, decreased ROM, decreased strength, increased edema, impaired flexibility, postural dysfunction, and pain.   ACTIVITY LIMITATIONS: carrying, lifting, sleeping, bathing, dressing, reach over head, and hygiene/grooming  PARTICIPATION LIMITATIONS: meal prep and occupation  PERSONAL FACTORS: 1-2 comorbidities: see PMH are also affecting patient's functional outcome.   REHAB POTENTIAL: Good  CLINICAL DECISION MAKING: Stable/uncomplicated  EVALUATION COMPLEXITY: Low   GOALS: Goals reviewed with patient? Yes  SHORT TERM GOALS: (target date for Short term goals 11/12/2023)  1.Patient will demonstrate independent use of home exercise program to maintain progress from in clinic treatments. Baseline:  SEE OBJECTIVE DATA Goal status: On-going 10/26/23  LONG TERM GOALS: (target dates for all long term goals 12/16/2023 )   1. Patient will demonstrate/report pain at worst less than or equal to 2/10 to facilitate minimal limitation in daily activity  secondary to pain symptoms. Baseline: SEE OBJECTIVE DATA Goal status: INITIAL   2. Patient will demonstrate independent use of home exercise program to facilitate ability to maintain/progress functional gains from skilled physical therapy services. Baseline: SEE OBJECTIVE DATA Goal status: INITIAL   3.  Patient reports Patient-Specific Activity Score improved the average by 3 to indicate improvement in functional activities.  Baseline: SEE OBJECTIVE DATA Goal status: INITIAL   4.  Patient will demonstrate left UE MMT 5/5 throughout to facilitate lifting, reaching, carrying at [facility] in daily activity.  Baseline: SEE OBJECTIVE DATA Goal status: INITIAL   5.  Patient will demonstrate left GH joint AROM WFL s symptoms to facilitate usual overhead reaching, self care, dressing at PLOF.  Baseline: SEE OBJECTIVE DATA Goal status: INITIAL      PLAN: PT FREQUENCY: 1-2x/week  PT DURATION: 8 weeks  PLANNED INTERVENTIONS: 97164- PT Re-evaluation, 97110-Therapeutic exercises, 97530- Therapeutic activity, [id number]- Neuromuscular re-education, 97535- Self Care, 16109- Manual therapy, [id number]- Electrical stimulation (unattended), [id number]- Electrical stimulation (manual), 97016- Vasopneumatic device, [id number]- Ionotophoresis 4mg /ml Dexamethasone , Patient/Family education, Taping, Dry Needling, Joint mobilization, Cryotherapy, and Moist heat  PLAN FOR NEXT SESSION:  IR mobility recheck.  Needling, percussive device for infraspinatus?    Bonna Bustard, PT, DPT, OCS, ATC 11/05/23  8:42 AM

## 2023-11-09 ENCOUNTER — Ambulatory Visit: Admitting: Rehabilitative and Restorative Service Providers"

## 2023-11-09 ENCOUNTER — Encounter: Payer: Self-pay | Admitting: Rehabilitative and Restorative Service Providers"

## 2023-11-09 DIAGNOSIS — M6281 Muscle weakness (generalized): Secondary | ICD-10-CM | POA: Diagnosis not present

## 2023-11-09 DIAGNOSIS — M25512 Pain in left shoulder: Secondary | ICD-10-CM

## 2023-11-09 DIAGNOSIS — R600 Localized edema: Secondary | ICD-10-CM

## 2023-11-09 DIAGNOSIS — R293 Abnormal posture: Secondary | ICD-10-CM | POA: Diagnosis not present

## 2023-11-09 DIAGNOSIS — G8929 Other chronic pain: Secondary | ICD-10-CM

## 2023-11-09 DIAGNOSIS — M25612 Stiffness of left shoulder, not elsewhere classified: Secondary | ICD-10-CM

## 2023-11-09 NOTE — Therapy (Signed)
 OUTPATIENT PHYSICAL THERAPY TREATMENT   Patient Name: George Hays MRN: [id number] DOB:[birth year], 58 y.o., male Today's Date: 11/09/2023  END OF SESSION:  PT End of Session - 11/09/23 0846     Visit Number 5    Number of Visits 8    Date for PT Re-Evaluation 12/16/23    Authorization Type [employer] Federal Emp PPO    PT Start Time [phone]    PT Stop Time 0924    PT Time Calculation (min) 42 min    Activity Tolerance Patient tolerated treatment well    Behavior During Therapy WFL for tasks assessed/performed                 Past Medical History:  Diagnosis Date   Anemia    Arthritis    GERD (gastroesophageal reflux disease)    chronic indigestion   Hypertension    Pre-diabetes    Past Surgical History:  Procedure Laterality Date   ADENOIDECTOMY     FINGER AMPUTATION     finger repair after partial amp. -- right middle finger   TONSILLECTOMY     TOTAL HIP ARTHROPLASTY Left 09/27/2020   Procedure: LEFT TOTAL HIP ARTHROPLASTY ANTERIOR APPROACH;  Surgeon: Arnie Lao, MD;  Location: [facility];  Service: Orthopedics;  Laterality: Left;   Patient Active Problem List   Diagnosis Date Noted   Unilateral primary osteoarthritis, left hip 07/06/2019   Obesity 05/08/2014   DOE (dyspnea on exertion) 03/10/2013   OSA (obstructive sleep apnea) 03/10/2013    PCP: Allene Ivan, MD  REFERRING PROVIDER: Arnie Lao*  REFERRING DIAG: [phone] (ICD-10-CM) - Chronic left shoulder pain   THERAPY DIAG:  Chronic left shoulder pain Muscle weakness (generalized) Stiffness of left shoulder, not elsewhere classified Abnormal posture Localized edema   Rationale for Evaluation and Treatment: Rehabilitation  ONSET DATE: 09/30/2023  MD office visit with PT referral  SUBJECTIVE:                                                                                                                                                                                       SUBJECTIVE STATEMENT: Pt indicated having some pain complaints with reaching "weird" into fridge at an awkward angle.  PT indicated sore for a few minutes.  Reported very positive results from massage gun use.    Hand dominance: Left  PERTINENT HISTORY: Left THA 09/27/20, OA, gout, HTN, obesity  PAIN:  NPRS scale:current 0/10, at worst 5/10 with reaching in fridge.  Pain location: Lt anterior shoulder Pain description: tightness, pinch pain Aggravating factors: outstretched reaching Relieving factors: moves in good position (against chest)   PRECAUTIONS: None  RED FLAGS: None   WEIGHT BEARING RESTRICTIONS: No  FALLS:  Has patient fallen in last 6 months? 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  DIAGNOSTIC FINDINGS:  09/30/23 X-ray 3 views of the left shoulder show no acute findings.  There is decrease in the subacromial outlet.  Patient-Specific Activity Scoring Scheme  "0" represents "unable to perform." "10" represents "able to perform at prior level. 0 1 2 3 4 5 6 7 8 9  10 (Date and Score)   Activity Eval  10/21/2023    1. Lift arm overhead 2     2. Push up from floor / dog care  2    3. Lift items 3   4.reach behind  back / ADLs 1   5.    Score 2    Total score = sum of the activity scores/number of activities Minimum detectable change (90%CI) for average score = 2 points Minimum detectable change (90%CI) for single activity score = 3 points  COGNITION: Overall cognitive status: Within functional limits for tasks assessed     SENSATION: 10/21/2023 [facility]  POSTURE: 10/21/2023 Head forward, rounded  shoulder with guarding left UE  UPPER EXTREMITY ROM:   ROM Left Eval 10/21/2023 Left 10/26/23 supine Left 11/02/23 Left 11/05/2023 Left 11/09/2023  Shoulder flexion Seated A: 108* P: 113* painful end feel AA: 148     Shoulder extension Seated A: 41* P:45*  Painful end feel      Shoulder abduction Seated A: 61* P:72*  Painful end feel      Shoulder adduction       Shoulder internal rotation Supine 90* abd A: 37* P:42*  Painful end feel  Supine shoulder Abd 90 A: 72 Supine 90* abd AROM 76    Shoulder external rotation Supine 45* abd A: 37* P:42*  Painful end feel   Supine shoulder abd 90 A: 58    Elbow flexion       Elbow extension WFL                                  Hand behind back     T11         (Blank rows = not tested)  UPPER EXTREMITY MMT:  MMT Left Eval 10/21/2023 Left 11/05/2023   Shoulder flexion 4/5 Pain limiting 5/5  Shoulder extension 4/5 Pain limiting   Shoulder abduction 4/5 Pain limiting 5/5  Shoulder adduction    Shoulder internal rotation 4-/5 Pain limiting 5/5  Shoulder external rotation 4/5 Pain limiting 5/5  Middle trapezius 4/5   Lower trapezius    Elbow flexion    Elbow extension    Wrist flexion    Wrist extension    Wrist ulnar deviation    Wrist radial deviation    Wrist pronation    Wrist supination    Grip strength (lbs)    (Blank rows = not tested)  SHOULDER SPECIAL TESTS: 10/21/2023 Impingement tests: Neer impingement test: positive  and Hawkins/Kennedy impingement test: positive  Biceps assessment: Yergason's test: negative  JOINT MOBILITY TESTING:  10/21/2023 No noted  tightness with painful end feel  PALPATION:  10/21/2023 Tenderness along anterior Glenohumeral joint, acromion and deltoid bursa.  Tightness in middle Trap & scalenes but pt denies pain                    TODAY'S TREATMENT:                                                                                    DATE: 11/09/2023 Therex: UBE fwd/back 3  mins each way lvl 3.5 Standing blue band ER with towel under arm 2 x 15, bilaterally  Printout of updated HEP with review.   Neuro Re-ed (muscle recruitment, postural activation, scapular control) Standing blue band GH ext 2 x 15 Standing rows c scapular retraction 2 x 15 Wall push up with SA press 3 sec hold 2 x 10  Standing 2 lb ball wall 90 deg flexion small circles with outstretched arm 2 x30 seconds cw, ccw each   Manual: Percussive device to Lt infraspinatus , middle trap with trigger point STM  TODAY'S TREATMENT:                                                                                    DATE: 11/05/2023 Therex: Review of posterior capsule stretching with discussion and education on cross arm stretch, sleeper stretch and IR rope stretch with emphasis on 30 sec holds UBE fwd/back 3 mins each way lvl 3.5  Neuro Re-ed (muscle recruitment, postural activation, scapular control) Green tband ER c flexion punch Lt arm 2 x 10 Wall push up with SA press 3 sec hold x 15 Standing 2 lb ball wall 90 deg flexion small circles with outstretched arm 2 x30 seconds cw, ccw each   Manual: Percussive device to Lt infraspinatus , middle trap with trigger point STM   TODAY'S TREATMENT:                                                                                    DATE: 11/02/2023 TherEx UBE: level 3 x 2 minutes each direction Standing shoulder abd: 2 # 2 x 10  Sitting: cross body stretch: x 3 holding 20 sec Sitting: IR of left shoulder behind back, clasping hands along lumbar region and holding 20 sec x 2   TherAct Rows: blue TB 2 x 15 holding 3 sec  Shoulder extension blue TB 2 x 15 holding 3 sec Shoulder ER: green TB 2 x 15 holding 3 sec  Manual:  Trigger Point Dry Needling Skilled palpation  of active trigger points during TPDN Subsequent Treatment: Instructions provided previously at initial dry needling treatment.  Instructions reviewed, if requested by the patient, prior to  subsequent dry needling treatment.  Patient Verbal Consent Given: Yes Education Handout Provided: Previously Provided Muscles treated: anterior, middle deltoid on the left Treatment response/outcome: local twitch response   PATIENT EDUCATION: 11/09/2023 Education details: HEP update Person educated: Patient Education method: Programmer, multimedia, Demonstration, Verbal cues, and Handouts Education comprehension: verbalized understanding, returned demonstration, and verbal cues required  HOME EXERCISE PROGRAM: Access Code: [email or web address] URL: [email or web address] Date: 11/09/2023 Prepared by: Bonna Bustard  Exercises - Supine Chin Tuck  - 1-3 x daily - 7 x weekly - 2 sets - 10 reps - 5 seconds hold - Standing Shoulder Posterior Capsule Stretch  - 2 x daily - 7 x weekly - 1 sets - 5 reps - 15-30 hold - Standing Shoulder Internal Rotation Stretch with Towel (Mirrored)  - 2 x daily - 7 x weekly - 1 sets - 5 reps - 30 hold - Sleeper Stretch  - 2 x daily - 7 x weekly - 1 sets - 5 reps - 30 hold - Doorway Pec Stretch at 60 Elevation  - 1-2 x daily - 7 x weekly - 3 reps - 20 seconds hold - Seated Passive Cervical Retraction  - 1-3 x daily - 7 x weekly - 2 sets - 10 reps - 5 seconds hold - Standing Row with Anchored Resistance  - 1-2 x daily - 7 x weekly - 2 sets - 10 reps - 3 seconds hold - Shoulder External Rotation Reactive Isometrics  - 1-2 x daily - 7 x weekly - 10 reps - Shoulder Internal Rotation Reactive Isometrics  - 1-2 x daily - 7 x weekly - 10 reps - Shoulder Extension with Resistance  - 1-2 x daily - 7 x weekly - 1-2 sets - 10-15 reps - Wall Push Up with Plus  - 1 x daily - 7 x weekly - 1-2 sets - 10-15 reps  ASSESSMENT:  CLINICAL IMPRESSION: Positive gains in Lt arm mobility noted with positive improvement with percussive device to posterior rotator cuff.  Progressed and adjusted HEP in accordance to progression noted.   OBJECTIVE IMPAIRMENTS: decreased activity tolerance,  decreased endurance, decreased knowledge of condition, decreased ROM, decreased strength, increased edema, impaired flexibility, postural dysfunction, and pain.   ACTIVITY LIMITATIONS: carrying, lifting, sleeping, bathing, dressing, reach over head, and hygiene/grooming  PARTICIPATION LIMITATIONS: meal prep and occupation  PERSONAL FACTORS: 1-2 comorbidities: see PMH are also affecting patient's functional outcome.   REHAB POTENTIAL: Good  CLINICAL DECISION MAKING: Stable/uncomplicated  EVALUATION COMPLEXITY: Low   GOALS: Goals reviewed with patient? Yes  SHORT TERM GOALS: (target date for Short term goals 11/12/2023)  1.Patient will demonstrate independent use of home exercise program to maintain progress from in clinic treatments. Baseline: SEE OBJECTIVE DATA Goal status: Met  LONG TERM GOALS: (target dates for all long term goals 12/16/2023 )   1. Patient will demonstrate/report pain at worst less than or equal to 2/10 to facilitate minimal limitation in daily activity secondary to pain symptoms. Baseline: SEE OBJECTIVE DATA Goal status: on going 11/09/2023   2. Patient will demonstrate independent use of home exercise program to facilitate ability to maintain/progress functional gains from skilled physical therapy services. Baseline: SEE OBJECTIVE DATA Goal status: on going 11/09/2023   3.  Patient reports Patient-Specific Activity Score improved the average by 3 to indicate improvement in functional activities.  Baseline: SEE OBJECTIVE  DATA Goal status: on going 11/09/2023   4.  Patient will demonstrate left UE MMT 5/5 throughout to facilitate lifting, reaching, carrying at [facility] in daily activity.  Baseline: SEE OBJECTIVE DATA Goal status: on going 11/09/2023   5.  Patient will demonstrate left GH joint AROM WFL s symptoms to facilitate usual overhead reaching, self care, dressing at PLOF.  Baseline: SEE OBJECTIVE DATA Goal status: on going 11/09/2023      PLAN: PT  FREQUENCY: 1-2x/week  PT DURATION: 8 weeks  PLANNED INTERVENTIONS: 97164- PT Re-evaluation, 97110-Therapeutic exercises, 97530- Therapeutic activity, 97112- Neuromuscular re-education, 97535- Self Care, 65784- Manual therapy, G0283- Electrical stimulation (unattended), [id number]- Electrical stimulation (manual), 97016- Vasopneumatic device, [id number]- Ionotophoresis 4mg /ml Dexamethasone , Patient/Family education, Taping, Dry Needling, Joint mobilization, Cryotherapy, and Moist heat  PLAN FOR NEXT SESSION:  Percussive device as desired.  Recheck objective ROM for shoulder.  If continued improvement, possible HEP transitioning discussion.    Bonna Bustard, PT, DPT, OCS, ATC 11/09/23  9:28 AM

## 2023-11-11 NOTE — Therapy (Signed)
 OUTPATIENT PHYSICAL THERAPY TREATMENT   Patient Name: George Hays MRN: [id number] DOB:05-28-1966, 58 y.o., male Today's Date: 11/12/2023  END OF SESSION:  PT End of Session - 11/12/23 0803     Visit Number 6    Number of Visits 8    Date for PT Re-Evaluation 12/16/23    Authorization Type [employer] Federal Emp PPO    PT Start Time [phone]    PT Stop Time 0837    PT Time Calculation (min) 39 min    Activity Tolerance Patient tolerated treatment well    Behavior During Therapy WFL for tasks assessed/performed                  Past Medical History:  Diagnosis Date   Anemia    Arthritis    GERD (gastroesophageal reflux disease)    chronic indigestion   Hypertension    Pre-diabetes    Past Surgical History:  Procedure Laterality Date   ADENOIDECTOMY     FINGER AMPUTATION     finger repair after partial amp. -- right middle finger   TONSILLECTOMY     TOTAL HIP ARTHROPLASTY Left 09/27/2020   Procedure: LEFT TOTAL HIP ARTHROPLASTY ANTERIOR APPROACH;  Surgeon: Arnie Lao, MD;  Location: [facility];  Service: Orthopedics;  Laterality: Left;   Patient Active Problem List   Diagnosis Date Noted   Unilateral primary osteoarthritis, left hip 07/06/2019   Obesity 05/08/2014   DOE (dyspnea on exertion) 03/10/2013   OSA (obstructive sleep apnea) 03/10/2013    PCP: Allene Ivan, MD  REFERRING PROVIDER: Arnie Lao*  REFERRING DIAG: [phone] (ICD-10-CM) - Chronic left shoulder pain   THERAPY DIAG:  Chronic left shoulder pain Muscle weakness (generalized) Stiffness of left shoulder, not elsewhere classified Abnormal posture Localized edema   Rationale for Evaluation and Treatment: Rehabilitation  ONSET DATE: 09/30/2023  MD office visit with PT referral  SUBJECTIVE:                                                                                                                                                                                       SUBJECTIVE STATEMENT: Pt indicated having every now and then twinge with grabbing things.     Hand dominance: Left  PERTINENT HISTORY: Left THA 09/27/20, OA, gout, HTN, obesity  PAIN:  NPRS scale:   at most: 5/10 Pain location: Lt anterior shoulder Pain description: tightness, pinch pain Aggravating factors: outstretched reaching Relieving factors: moves in good position (against chest)   PRECAUTIONS: None  RED FLAGS: None   WEIGHT BEARING RESTRICTIONS: No  FALLS:  Has patient fallen in last 6 months? 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  DIAGNOSTIC FINDINGS:  09/30/23 X-ray 3 views of the left shoulder show no acute findings.  There is decrease in the subacromial outlet.  Patient-Specific Activity Scoring Scheme  "0" represents "unable to perform." "10" represents "able to perform at prior level. 0 1 2 3 4 5 6 7 8 9  10 (Date and Score)   Activity Eval  10/21/2023 11/12/2023   1. Lift arm overhead 2   10  2. Push up from floor / dog care  2 9   3. Lift items 3 10  4.reach behind  back / ADLs 1 8  5.    Score 2 9.25 avg    Total score = sum of the activity scores/number of activities Minimum detectable change (90%CI) for average score = 2 points Minimum detectable change (90%CI) for single activity score = 3 points  COGNITION: Overall cognitive status: Within functional limits for tasks assessed     SENSATION: 10/21/2023 [facility]  POSTURE: 10/21/2023 Head forward, rounded shoulder with guarding left UE  UPPER EXTREMITY ROM:   ROM Left Eval 10/21/2023 Left 10/26/23 supine Left 11/02/23 Left 11/05/2023  Left 11/09/2023  Shoulder flexion Seated A: 108* P: 113* painful end feel AA: 148     Shoulder extension Seated A: 41* P:45*  Painful end feel      Shoulder abduction Seated A: 61* P:72*  Painful end feel      Shoulder adduction       Shoulder internal rotation Supine 90* abd A: 37* P:42*  Painful end feel  Supine shoulder Abd 90 A: 72 Supine 90* abd AROM 76    Shoulder external rotation Supine 45* abd A: 37* P:42*  Painful end feel   Supine shoulder abd 90 A: 58    Elbow flexion       Elbow extension WFL                                  Hand behind back     T11         (Blank rows = not tested)  UPPER EXTREMITY MMT:  MMT Left Eval 10/21/2023 Left 11/05/2023   Shoulder flexion 4/5 Pain limiting 5/5  Shoulder extension 4/5 Pain limiting   Shoulder abduction 4/5 Pain limiting 5/5  Shoulder adduction    Shoulder internal rotation 4-/5 Pain limiting 5/5  Shoulder external rotation 4/5 Pain limiting 5/5  Middle trapezius 4/5   Lower trapezius    Elbow flexion    Elbow extension    Wrist flexion    Wrist extension    Wrist ulnar deviation    Wrist radial deviation    Wrist pronation    Wrist supination    Grip strength (lbs)    (Blank rows = not tested)  SHOULDER SPECIAL TESTS: 10/21/2023 Impingement tests: Neer impingement test: positive  and Hawkins/Kennedy impingement test: positive  Biceps assessment: Yergason's test: negative  JOINT MOBILITY TESTING:  10/21/2023 No noted tightness with painful end feel  PALPATION:  10/21/2023 Tenderness along anterior Glenohumeral joint, acromion and deltoid bursa.  Tightness in  middle Trap & scalenes but pt denies pain                    TODAY'S TREATMENT:                                                                                    DATE: 11/11/2023 Therex: UBE fwd/back 4 mins each way lvl 4.0 Standing blue band ER with towel under arm 3 x 10  Lt   Standing blue band IR with towel under arm 3 x 10 Lt     Neuro Re-ed (muscle recruitment, postural activation, scapular control) Standing blue band GH ext 2 x 15 Standing rows c scapular retraction 2 x 15 Wall push up with SA press 3 sec hold 2 x 10    Manual: Percussive device to Lt infraspinatus , middle trap with trigger point STM  TODAY'S TREATMENT:                                                                                    DATE: 11/09/2023 Therex: UBE fwd/back 3 mins each way lvl 3.5 Standing blue band ER with towel under arm 2 x 15, bilaterally  Printout of updated HEP with review.   Neuro Re-ed (muscle recruitment, postural activation, scapular control) Standing blue band GH ext 2 x 15 Standing rows c scapular retraction 2 x 15 Wall push up with SA press 3 sec hold 2 x 10  Standing 2 lb ball wall 90 deg flexion small circles with outstretched arm 2 x30 seconds cw, ccw each   Manual: Percussive device to Lt infraspinatus , middle trap with trigger point STM  TODAY'S TREATMENT:                                                                                    DATE: 11/05/2023 Therex: Review of posterior capsule stretching with discussion and education on cross arm stretch, sleeper stretch and IR rope stretch with emphasis on 30 sec holds UBE fwd/back 3 mins each way lvl 3.5  Neuro Re-ed (muscle recruitment, postural activation, scapular control) Green tband ER c flexion punch Lt arm 2 x 10 Wall push up with SA press 3 sec hold x 15 Standing 2 lb ball wall 90 deg flexion small circles with outstretched arm 2 x30 seconds cw, ccw each   Manual: Percussive device to Lt infraspinatus , middle trap with trigger point STM   PATIENT EDUCATION: 11/09/2023 Education details: HEP update Person educated: Patient Education method: Programmer, multimedia, Demonstration, Verbal cues,  and Handouts Education comprehension: verbalized understanding, returned demonstration, and verbal cues required  HOME EXERCISE PROGRAM: Access Code:  [email or web address] URL: [email or web address] Date: 11/09/2023 Prepared by: Bonna Bustard  Exercises - Supine Chin Tuck  - 1-3 x daily - 7 x weekly - 2 sets - 10 reps - 5 seconds hold - Standing Shoulder Posterior Capsule Stretch  - 2 x daily - 7 x weekly - 1 sets - 5 reps - 15-30 hold - Standing Shoulder Internal Rotation Stretch with Towel (Mirrored)  - 2 x daily - 7 x weekly - 1 sets - 5 reps - 30 hold - Sleeper Stretch  - 2 x daily - 7 x weekly - 1 sets - 5 reps - 30 hold - Doorway Pec Stretch at 60 Elevation  - 1-2 x daily - 7 x weekly - 3 reps - 20 seconds hold - Seated Passive Cervical Retraction  - 1-3 x daily - 7 x weekly - 2 sets - 10 reps - 5 seconds hold - Standing Row with Anchored Resistance  - 1-2 x daily - 7 x weekly - 2 sets - 10 reps - 3 seconds hold - Shoulder External Rotation Reactive Isometrics  - 1-2 x daily - 7 x weekly - 10 reps - Shoulder Internal Rotation Reactive Isometrics  - 1-2 x daily - 7 x weekly - 10 reps - Shoulder Extension with Resistance  - 1-2 x daily - 7 x weekly - 1-2 sets - 10-15 reps - Wall Push Up with Plus  - 1 x daily - 7 x weekly - 1-2 sets - 10-15 reps  ASSESSMENT:  CLINICAL IMPRESSION: Patient specific functional scale update showed marked improvement compared to evaluation.  Continued improvement in symptoms noted.  Pt will return next week and may start reducing frequency of visits due to improvement.   OBJECTIVE IMPAIRMENTS: decreased activity tolerance, decreased endurance, decreased knowledge of condition, decreased ROM, decreased strength, increased edema, impaired flexibility, postural dysfunction, and pain.   ACTIVITY LIMITATIONS: carrying, lifting, sleeping, bathing, dressing, reach over head, and hygiene/grooming  PARTICIPATION LIMITATIONS: meal prep and occupation  PERSONAL FACTORS: 1-2 comorbidities: see PMH are also affecting patient's functional outcome.   REHAB POTENTIAL: Good  CLINICAL DECISION MAKING:  Stable/uncomplicated  EVALUATION COMPLEXITY: Low   GOALS: Goals reviewed with patient? Yes  SHORT TERM GOALS: (target date for Short term goals 11/12/2023)  1.Patient will demonstrate independent use of home exercise program to maintain progress from in clinic treatments. Baseline: SEE OBJECTIVE DATA Goal status: Met  LONG TERM GOALS: (target dates for all long term goals 12/16/2023 )   1. Patient will demonstrate/report pain at worst less than or equal to 2/10 to facilitate minimal limitation in daily activity secondary to pain symptoms. Baseline: SEE OBJECTIVE DATA Goal status: on going 11/09/2023   2. Patient will demonstrate independent use of home exercise program to facilitate ability to maintain/progress functional gains from skilled physical therapy services. Baseline: SEE OBJECTIVE DATA Goal status: on going 11/09/2023   3.  Patient reports Patient-Specific Activity Score improved the average by 3 to indicate improvement in functional activities.  Baseline: SEE OBJECTIVE DATA Goal status: Met 11/12/2023   4.  Patient will demonstrate left UE MMT 5/5 throughout to facilitate lifting, reaching, carrying at [facility] in daily activity.  Baseline: SEE OBJECTIVE DATA Goal status: Met 11/12/2023 5.  Patient will demonstrate left GH joint AROM WFL s symptoms to facilitate usual overhead reaching, self care, dressing at PLOF.  Baseline: SEE OBJECTIVE DATA Goal status: on  going 11/09/2023      PLAN: PT FREQUENCY: 1-2x/week  PT DURATION: 8 weeks  PLANNED INTERVENTIONS: 97164- PT Re-evaluation, 97110-Therapeutic exercises, 97530- Therapeutic activity, 97112- Neuromuscular re-education, 97535- Self Care, 27253- Manual therapy, G0283- Electrical stimulation (unattended), [id number]- Electrical stimulation (manual), 97016- Vasopneumatic device, [id number]- Ionotophoresis 4mg /ml Dexamethasone , Patient/Family education, Taping, Dry Needling, Joint mobilization, Cryotherapy, and Moist heat  PLAN FOR  NEXT SESSION:  HEP transitioning/ reduced frequency.   Bonna Bustard, PT, DPT, OCS, ATC 11/12/23  8:41 AM

## 2023-11-12 ENCOUNTER — Encounter: Payer: Self-pay | Admitting: Rehabilitative and Restorative Service Providers"

## 2023-11-12 ENCOUNTER — Ambulatory Visit: Admitting: Rehabilitative and Restorative Service Providers"

## 2023-11-12 DIAGNOSIS — R293 Abnormal posture: Secondary | ICD-10-CM

## 2023-11-12 DIAGNOSIS — R600 Localized edema: Secondary | ICD-10-CM

## 2023-11-12 DIAGNOSIS — M6281 Muscle weakness (generalized): Secondary | ICD-10-CM

## 2023-11-12 DIAGNOSIS — M25612 Stiffness of left shoulder, not elsewhere classified: Secondary | ICD-10-CM

## 2023-11-12 DIAGNOSIS — M25512 Pain in left shoulder: Secondary | ICD-10-CM

## 2023-11-12 DIAGNOSIS — G8929 Other chronic pain: Secondary | ICD-10-CM

## 2023-11-17 ENCOUNTER — Ambulatory Visit: Admitting: Physical Therapy

## 2023-11-17 ENCOUNTER — Encounter: Payer: Self-pay | Admitting: Physical Therapy

## 2023-11-17 DIAGNOSIS — M25512 Pain in left shoulder: Secondary | ICD-10-CM

## 2023-11-17 DIAGNOSIS — R600 Localized edema: Secondary | ICD-10-CM

## 2023-11-17 DIAGNOSIS — G8929 Other chronic pain: Secondary | ICD-10-CM

## 2023-11-17 DIAGNOSIS — R293 Abnormal posture: Secondary | ICD-10-CM

## 2023-11-17 DIAGNOSIS — M6281 Muscle weakness (generalized): Secondary | ICD-10-CM

## 2023-11-17 DIAGNOSIS — M25612 Stiffness of left shoulder, not elsewhere classified: Secondary | ICD-10-CM

## 2023-11-17 NOTE — Therapy (Addendum)
 OUTPATIENT PHYSICAL THERAPY TREATMENT   Patient Name: George Hays MRN: [id number] DOB:05-06-1966, 58 y.o., male Today's Date: 11/17/2023  END OF SESSION:  PT End of Session - 11/17/23 0900     Visit Number 7    Number of Visits 8    Date for PT Re-Evaluation 12/16/23    Authorization Type [employer] Federal [employer] PPO    Authorization Time Period $30 copay    PT Start Time 0802    PT Stop Time 0845    PT Time Calculation (min) 43 min    Activity Tolerance Patient tolerated treatment well    Behavior During Therapy WFL for tasks assessed/performed                   Past Medical History:  Diagnosis Date   Anemia    Arthritis    GERD (gastroesophageal reflux disease)    chronic indigestion   Hypertension    Pre-diabetes    Past Surgical History:  Procedure Laterality Date   ADENOIDECTOMY     FINGER AMPUTATION     finger repair after partial amp. -- right middle finger   TONSILLECTOMY     TOTAL HIP ARTHROPLASTY Left 09/27/2020   Procedure: LEFT TOTAL HIP ARTHROPLASTY ANTERIOR APPROACH;  Surgeon: Arnie Lao, MD;  Location: [facility];  Service: Orthopedics;  Laterality: Left;   Patient Active Problem List   Diagnosis Date Noted   Unilateral primary osteoarthritis, left hip 07/06/2019   Obesity 05/08/2014   DOE (dyspnea on exertion) 03/10/2013   OSA (obstructive sleep apnea) 03/10/2013    PCP: Allene Ivan, MD  REFERRING PROVIDER: Arnie Lao*  REFERRING DIAG: [phone] (ICD-10-CM) - Chronic left shoulder pain   THERAPY DIAG:  Chronic left shoulder pain Muscle weakness (generalized) Stiffness of left shoulder, not elsewhere classified Abnormal posture Localized edema   Rationale for Evaluation and Treatment: Rehabilitation  ONSET DATE: 09/30/2023  MD office visit with PT referral  SUBJECTIVE:                                                                                                                                                                                       SUBJECTIVE STATEMENT: Pt stating the only time he has pain now is when he is reaching backward and tying to lift something.   Hand dominance: Left  PERTINENT HISTORY: Left THA 09/27/20, OA, gout, HTN, obesity  PAIN:  NPRS scale:  no pain upon arrival, worse pain 5-6/10 Pain location: Lt anterior shoulder Pain description: tightness, pinch pain Aggravating factors: outstretched reaching Relieving factors: moves in good position (against chest)   PRECAUTIONS: None  RED FLAGS: None  WEIGHT BEARING RESTRICTIONS: No  FALLS:  Has patient fallen in last 6 months? 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  DIAGNOSTIC FINDINGS:  09/30/23 X-ray 3 views of the left shoulder show no acute findings.  There is decrease in the subacromial outlet.  Patient-Specific Activity Scoring Scheme  "0" represents "unable to perform." "10" represents "able to perform at prior level. 0 1 2 3 4 5 6 7 8 9  10 (Date and Score)   Activity Eval  10/21/2023 11/12/2023   1. Lift arm overhead 2   10  2. Push up from floor / dog care  2 9   3. Lift items 3 10  4.reach behind  back / ADLs 1 8  5.    Score 2 9.25 avg    Total score = sum of the activity scores/number of activities Minimum detectable change (90%CI) for average score = 2 points Minimum detectable change (90%CI) for single activity score = 3 points  COGNITION: Overall cognitive status: Within functional limits for tasks assessed     SENSATION: 10/21/2023 [facility]  POSTURE: 10/21/2023 Head forward, rounded shoulder with guarding left UE  UPPER  EXTREMITY ROM:   ROM Left Eval 10/21/2023 Left 10/26/23 supine Left 11/02/23 Left 11/05/2023 Left 11/09/2023 Left 11/16/23  Shoulder flexion Seated A: 108* P: 113* painful end feel AA: 148      Shoulder extension Seated A: 41* P:45*  Painful end feel       Shoulder abduction Seated A: 61* P:72*  Painful end feel       Shoulder adduction        Shoulder internal rotation Supine 90* abd A: 37* P:42*  Painful end feel  Supine shoulder Abd 90 A: 72 Supine 90* abd AROM 76     Shoulder external rotation Supine 45* abd A: 37* P:42*  Painful end feel   Supine shoulder abd 90 A: 58     Elbow flexion        Elbow extension WFL                                       Hand behind back     T11 T10          (Blank rows = not tested)  UPPER EXTREMITY MMT:  MMT Left Eval 10/21/2023 Left 11/05/2023   Shoulder flexion 4/5 Pain limiting 5/5  Shoulder extension 4/5 Pain limiting   Shoulder abduction 4/5 Pain limiting 5/5  Shoulder adduction    Shoulder internal rotation 4-/5 Pain limiting 5/5  Shoulder external rotation 4/5 Pain limiting 5/5  Middle trapezius 4/5   Lower trapezius    Elbow flexion    Elbow extension    Wrist flexion    Wrist extension    Wrist ulnar deviation    Wrist radial deviation    Wrist pronation    Wrist supination    Grip strength (lbs)    (Blank rows = not tested)  SHOULDER SPECIAL TESTS: 10/21/2023 Impingement tests: Neer impingement test: positive  and Hawkins/Kennedy impingement test: positive  Biceps assessment: Yergason's test: negative  JOINT MOBILITY TESTING:  10/21/2023 No noted tightness with painful end feel  PALPATION:  10/21/2023 Tenderness along anterior Glenohumeral joint, acromion and deltoid bursa.  Tightness in middle Trap & scalenes but pt denies pain                    TODAY'S TREATMENT:                                                                                    DATE: 11/17/2023 Therex: UBE fwd/back 4 mins each  way lvl 4.0 Standing blue band ER with towel under arm 3 x 10  Lt   Standing blue band IR with towel under arm 3 x 10 Lt  BATCA: Tricep extension 20# 2 x 10  Prone I's, T's Y's 2 x 10 each   Neuro Re-ed (muscle recruitment, postural activation, scapular control) BATCA: GH shoulder extension 5# bil UE's 2 x 10 BATCA rows: 20# c scapular retraction 2 x 15, holding 3 sec Wall push ups 2 x 10  Walking ball up the wall holding end range x 5 sec   Manual:  Percussion to left infraspinatus with movement x 3 minutes      TODAY'S TREATMENT:                                                                                    DATE: 11/11/2023 Therex: UBE fwd/back 4 mins each way lvl 4.0 Standing blue band ER with towel under arm 3 x 10  Lt   Standing blue band IR with towel under arm 3 x 10 Lt    Neuro Re-ed (muscle recruitment, postural activation, scapular control) Standing blue band GH ext 2 x 15 Standing rows c scapular retraction 2 x 15 Wall push up with SA press 3 sec hold 2 x 10    Manual: Percussive device to Lt infraspinatus , middle trap with trigger point STM  TODAY'S TREATMENT:                                                                                    DATE: 11/09/2023 Therex: UBE fwd/back 3 mins each way lvl 3.5 Standing blue band ER with towel under arm 2 x 15, bilaterally  Printout of updated HEP with review.   Neuro Re-ed (muscle recruitment, postural activation, scapular control) Standing blue band GH ext 2 x 15 Standing rows c scapular retraction 2 x 15 Wall push up with SA press 3 sec hold 2  x 10  Standing 2 lb ball wall 90 deg flexion small circles with outstretched arm 2 x30 seconds cw, ccw each   Manual: Percussive device to Lt infraspinatus , middle trap with trigger point STM  TODAY'S TREATMENT:                                                                                    DATE: 11/05/2023 Therex: Review of posterior capsule stretching with  discussion and education on cross arm stretch, sleeper stretch and IR rope stretch with emphasis on 30 sec holds UBE fwd/back 3 mins each way lvl 3.5  Neuro Re-ed (muscle recruitment, postural activation, scapular control) Green tband ER c flexion punch Lt arm 2 x 10 Wall push up with SA press 3 sec hold x 15 Standing 2 lb ball wall 90 deg flexion small circles with outstretched arm 2 x30 seconds cw, ccw each   Manual: Percussive device to Lt infraspinatus , middle trap with trigger point STM   PATIENT EDUCATION: 11/09/2023 Education details: HEP update Person educated: Patient Education method: Programmer, multimedia, Demonstration, Verbal cues, and Handouts Education comprehension: verbalized understanding, returned demonstration, and verbal cues required  HOME EXERCISE PROGRAM: Access Code: [email or web address] URL: [email or web address] Date: 11/09/2023 Prepared by: Bonna Bustard  Exercises - Supine Chin Tuck  - 1-3 x daily - 7 x weekly - 2 sets - 10 reps - 5 seconds hold - Standing Shoulder Posterior Capsule Stretch  - 2 x daily - 7 x weekly - 1 sets - 5 reps - 15-30 hold - Standing Shoulder Internal Rotation Stretch with Towel (Mirrored)  - 2 x daily - 7 x weekly - 1 sets - 5 reps - 30 hold - Sleeper Stretch  - 2 x daily - 7 x weekly - 1 sets - 5 reps - 30 hold - Doorway Pec Stretch at 60 Elevation  - 1-2 x daily - 7 x weekly - 3 reps - 20 seconds hold - Seated Passive Cervical Retraction  - 1-3 x daily - 7 x weekly - 2 sets - 10 reps - 5 seconds hold - Standing Row with Anchored Resistance  - 1-2 x daily - 7 x weekly - 2 sets - 10 reps - 3 seconds hold - Shoulder External Rotation Reactive Isometrics  - 1-2 x daily - 7 x weekly - 10 reps - Shoulder Internal Rotation Reactive Isometrics  - 1-2 x daily - 7 x weekly - 10 reps - Shoulder Extension with Resistance  - 1-2 x daily - 7 x weekly - 1-2 sets - 10-15 reps - Wall Push Up with Plus  - 1 x daily - 7 x weekly - 1-2 sets - 10-15  reps  ASSESSMENT:  CLINICAL IMPRESSION: Pt has made progress with with his Left shoulder IR to T10 this visit. Pt reporting no pain at rest and only pain with extension and abduction when lifting. Pt tolerating exercises well and we discussed discharge in the next 1-2 visits pending progress toward goals.   OBJECTIVE IMPAIRMENTS: decreased activity tolerance, decreased endurance, decreased knowledge of condition, decreased ROM, decreased strength, increased edema, impaired flexibility, postural dysfunction, and pain.   ACTIVITY LIMITATIONS: carrying, lifting, sleeping,  bathing, dressing, reach over head, and hygiene/grooming  PARTICIPATION LIMITATIONS: meal prep and occupation  PERSONAL FACTORS: 1-2 comorbidities: see PMH are also affecting patient's functional outcome.   REHAB POTENTIAL: Good  CLINICAL DECISION MAKING: Stable/uncomplicated  EVALUATION COMPLEXITY: Low   GOALS: Goals reviewed with patient? Yes  SHORT TERM GOALS: (target date for Short term goals 11/12/2023)  1.Patient will demonstrate independent use of home exercise program to maintain progress from in clinic treatments. Baseline: SEE OBJECTIVE DATA Goal status: Met  LONG TERM GOALS: (target dates for all long term goals 12/16/2023 )   1. Patient will demonstrate/report pain at worst less than or equal to 2/10 to facilitate minimal limitation in daily activity secondary to pain symptoms. Baseline: SEE OBJECTIVE DATA Goal status: on going 11/09/2023   2. Patient will demonstrate independent use of home exercise program to facilitate ability to maintain/progress functional gains from skilled physical therapy services. Baseline: SEE OBJECTIVE DATA Goal status: on going 11/09/2023   3.  Patient reports Patient-Specific Activity Score improved the average by 3 to indicate improvement in functional activities.  Baseline: SEE OBJECTIVE DATA Goal status: Met 11/12/2023   4.  Patient will demonstrate left UE MMT 5/5  throughout to facilitate lifting, reaching, carrying at [facility] in daily activity.  Baseline: SEE OBJECTIVE DATA Goal status: Met 11/12/2023 5.  Patient will demonstrate left GH joint AROM WFL s symptoms to facilitate usual overhead reaching, self care, dressing at PLOF.  Baseline: SEE OBJECTIVE DATA Goal status: on going 11/09/2023      PLAN: PT FREQUENCY: 1-2x/week  PT DURATION: 8 weeks  PLANNED INTERVENTIONS: 97164- PT Re-evaluation, 97110-Therapeutic exercises, 97530- Therapeutic activity, 97112- Neuromuscular re-education, 97535- Self Care, 81191- Manual therapy, [id number]- Electrical stimulation (unattended), [id number]- Electrical stimulation (manual), 97016- Vasopneumatic device, [id number]- Ionotophoresis 4mg /ml Dexamethasone , Patient/Family education, Taping, Dry Needling, Joint mobilization, Cryotherapy, and Moist heat  PLAN FOR NEXT SESSION:  HEP transitioning, discharge in next 1-2 visits pending pt's progress toward goals set.   Jerrel Mor, PT, MPT 11/17/23 9:08 AM   11/17/23  9:08 AM

## 2023-11-23 ENCOUNTER — Encounter: Admitting: Physical Therapy

## 2023-11-30 ENCOUNTER — Ambulatory Visit: Admitting: Rehabilitative and Restorative Service Providers"

## 2023-11-30 ENCOUNTER — Encounter: Payer: Self-pay | Admitting: Rehabilitative and Restorative Service Providers"

## 2023-11-30 DIAGNOSIS — M25512 Pain in left shoulder: Secondary | ICD-10-CM

## 2023-11-30 DIAGNOSIS — R293 Abnormal posture: Secondary | ICD-10-CM

## 2023-11-30 DIAGNOSIS — M6281 Muscle weakness (generalized): Secondary | ICD-10-CM

## 2023-11-30 DIAGNOSIS — R600 Localized edema: Secondary | ICD-10-CM

## 2023-11-30 DIAGNOSIS — M25612 Stiffness of left shoulder, not elsewhere classified: Secondary | ICD-10-CM | POA: Diagnosis not present

## 2023-11-30 DIAGNOSIS — G8929 Other chronic pain: Secondary | ICD-10-CM

## 2023-11-30 NOTE — Therapy (Signed)
 OUTPATIENT PHYSICAL THERAPY TREATMENT / DISCHARGE   Patient Name: ZOHAIR Hays MRN: [id number] DOB:11/01/1965, 58 y.o., male Today's Date: 11/30/2023  PHYSICAL THERAPY DISCHARGE SUMMARY  Visits from Start of Care: 8  Current functional level related to goals / functional outcomes: See note   Remaining deficits: See note   Education / Equipment: HEP  Patient goals were met. Patient is being discharged due to being pleased with the current functional level.   END OF SESSION:  PT End of Session - 11/30/23 0800     Visit Number 8    Number of Visits 8    Date for PT Re-Evaluation 12/16/23    Authorization Type [employer] PPO    Authorization Time Period $30 copay    PT Start Time 0755 (P)     PT Stop Time 0834 (P)     PT Time Calculation (min) 39 min (P)     Activity Tolerance Patient tolerated treatment well    Behavior During Therapy WFL for tasks assessed/performed                    Past Medical History:  Diagnosis Date   Anemia    Arthritis    GERD (gastroesophageal reflux disease)    chronic indigestion   Hypertension    Pre-diabetes    Past Surgical History:  Procedure Laterality Date   ADENOIDECTOMY     FINGER AMPUTATION     finger repair after partial amp. -- right middle finger   TONSILLECTOMY     TOTAL HIP ARTHROPLASTY Left 09/27/2020   Procedure: LEFT TOTAL HIP ARTHROPLASTY ANTERIOR APPROACH;  Surgeon: Arnie Lao, MD;  Location: [facility];  Service: Orthopedics;  Laterality: Left;   Patient Active Problem List   Diagnosis Date Noted   Unilateral primary osteoarthritis, left hip 07/06/2019   Obesity 05/08/2014   DOE (dyspnea on exertion) 03/10/2013   OSA (obstructive sleep apnea) 03/10/2013    PCP: Allene Ivan, MD  REFERRING PROVIDER: Arnie Lao*  REFERRING DIAG: [phone] (ICD-10-CM) - Chronic left shoulder pain   THERAPY DIAG:  Chronic left shoulder pain Muscle weakness  (generalized) Stiffness of left shoulder, not elsewhere classified Abnormal posture Localized edema   Rationale for Evaluation and Treatment: Rehabilitation  ONSET DATE: 09/30/2023  MD office visit with PT referral  SUBJECTIVE:                                                                                                                                                                                      SUBJECTIVE STATEMENT: Pt indicated having improved hand behind back movements and fewer twinge reporting since last visit.  PERTINENT HISTORY: Left THA 09/27/20, OA, gout, HTN, obesity  PAIN:  NPRS scale:  no pain upon arrival, worse pain 5-6/10 Pain location: Lt anterior shoulder Pain description: tightness, pinch pain Aggravating factors: outstretched reaching Relieving factors: moves in good position (against chest)   PRECAUTIONS: None  RED FLAGS: None   WEIGHT BEARING RESTRICTIONS: No  FALLS:  Has patient fallen in last 6 months? 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  DIAGNOSTIC FINDINGS:  09/30/23 X-ray 3 views of the left shoulder show no acute findings.  There is decrease in the subacromial outlet.  Patient-Specific Activity Scoring Scheme  "0" represents "unable to perform." "10" represents "able to perform at prior level. 0 1 2 3 4 5 6 7 8 9  10 (Date and Score)   Activity Eval  10/21/2023 11/12/2023  11/30/2023  1. Lift arm overhead 2   10 10   2. Push up from floor / dog care  2 9  10   3. Lift items 3 10 10   4.reach behind  back / ADLs 1 8 8   5.     Score 2 9.25 avg  9.5 avg   Total score = sum of  the activity scores/number of activities Minimum detectable change (90%CI) for average score = 2 points Minimum detectable change (90%CI) for single activity score = 3 points  COGNITION: Overall cognitive status: Within functional limits for tasks assessed     SENSATION: 10/21/2023 [facility]  POSTURE: 10/21/2023 Head forward, rounded shoulder with guarding left UE  UPPER EXTREMITY ROM:   ROM Left Eval 10/21/2023 Left 10/26/23 supine Left 11/02/23 Left 11/05/2023 Left 11/09/2023 Left 11/16/23  Shoulder flexion Seated A: 108* P: 113* painful end feel AA: 148      Shoulder extension Seated A: 41* P:45*  Painful end feel       Shoulder abduction Seated A: 61* P:72*  Painful end feel       Shoulder adduction        Shoulder internal rotation Supine 90* abd A: 37* P:42*  Painful end feel  Supine shoulder Abd 90 A: 72 Supine 90* abd AROM 76     Shoulder external rotation Supine 45* abd A: 37* P:42*  Painful end feel   Supine shoulder abd 90 A: 58     Elbow flexion        Elbow extension WFL                                       Hand behind back     T11 T10          (Blank rows = not tested)  UPPER EXTREMITY MMT:  MMT Left Eval 10/21/2023 Left 11/05/2023    Shoulder flexion 4/5 Pain limiting 5/5   Shoulder extension 4/5 Pain limiting    Shoulder abduction 4/5 Pain limiting 5/5   Shoulder adduction     Shoulder internal rotation 4-/5 Pain limiting 5/5   Shoulder external rotation 4/5 Pain limiting 5/5   Middle trapezius 4/5    Lower trapezius  Elbow flexion     Elbow extension     Wrist flexion     Wrist extension     Wrist ulnar deviation     Wrist radial deviation     Wrist pronation     Wrist supination     Grip strength (lbs)     (Blank rows = not tested)  SHOULDER SPECIAL TESTS: 10/21/2023 Impingement tests: Neer impingement test: positive  and Hawkins/Kennedy impingement test: positive  Biceps assessment: Yergason's test: negative  JOINT  MOBILITY TESTING:  10/21/2023 No noted tightness with painful end feel  PALPATION:  10/21/2023 Tenderness along anterior Glenohumeral joint, acromion and deltoid bursa.  Tightness in middle Trap & scalenes but pt denies pain                    TODAY'S TREATMENT:                                                                                    DATE: 11/30/2023 Therex: UBE fwd/back 4 mins each way lvl 4.0 Review of HEP  Neuro Re-ed (muscle recruitment, postural activation, scapular control) Standing blue band GH ext 2 x 15 Standing rows c scapular retraction 2 x 15 Wall push up with SA press 3 sec hold 2 x 10  Standing 2 lb ball wall 90 deg flexion small circles with outstretched arm 2 x30 seconds cw, ccw each  - bilateral Standing 2 lb ball wall 90 deg abduction small circles with outstretched arm 2 x30 seconds cw, ccw each - bilateral  Blue band ER walk out isometric 5 sec hold x 10 with towel under arm Lt Blue band ER with flexion punch x 10    Manual: Percussive device to Lt infraspinatus , middle trap with trigger point STM   TODAY'S TREATMENT:                                                                                    DATE: 11/17/2023 Therex: UBE fwd/back 4 mins each way lvl 4.0 Standing blue band ER with towel under arm 3 x 10  Lt   Standing blue band IR with towel under arm 3 x 10 Lt  BATCA: Tricep extension 20# 2 x 10  Prone I's, T's Y's 2 x 10 each   Neuro Re-ed (muscle recruitment, postural activation, scapular control) BATCA: GH shoulder extension 5# bil UE's 2 x 10 BATCA rows: 20# c scapular retraction 2 x 15, holding 3 sec Wall push ups 2 x 10  Walking ball up the wall holding end range x 5 sec   Manual:  Percussion to left infraspinatus with movement x 3 minutes  TODAY'S TREATMENT:  DATE: 11/11/2023 Therex: UBE fwd/back 4 mins each way lvl 4.0 Standing blue band ER with  towel under arm 3 x 10  Lt   Standing blue band IR with towel under arm 3 x 10 Lt    Neuro Re-ed (muscle recruitment, postural activation, scapular control) Standing blue band GH ext 2 x 15 Standing rows c scapular retraction 2 x 15 Wall push up with SA press 3 sec hold 2 x 10    Manual: Percussive device to Lt infraspinatus , middle trap with trigger point STM  TODAY'S TREATMENT:                                                                                    DATE: 11/09/2023 Therex: UBE fwd/back 3 mins each way lvl 3.5 Standing blue band ER with towel under arm 2 x 15, bilaterally  Printout of updated HEP with review.   Neuro Re-ed (muscle recruitment, postural activation, scapular control) Standing blue band GH ext 2 x 15 Standing rows c scapular retraction 2 x 15 Wall push up with SA press 3 sec hold 2 x 10  Standing 2 lb ball wall 90 deg flexion small circles with outstretched arm 2 x30 seconds cw, ccw each   Manual: Percussive device to Lt infraspinatus , middle trap with trigger point STM  PATIENT EDUCATION: 11/09/2023 Education details: HEP update Person educated: Patient Education method: Programmer, multimedia, Demonstration, Verbal cues, and Handouts Education comprehension: verbalized understanding, returned demonstration, and verbal cues required  HOME EXERCISE PROGRAM: Access Code: [email or web address] URL: [email or web address] Date: 11/09/2023 Prepared by: Bonna Bustard  Exercises - Supine Chin Tuck  - 1-3 x daily - 7 x weekly - 2 sets - 10 reps - 5 seconds hold - Standing Shoulder Posterior Capsule Stretch  - 2 x daily - 7 x weekly - 1 sets - 5 reps - 15-30 hold - Standing Shoulder Internal Rotation Stretch with Towel (Mirrored)  - 2 x daily - 7 x weekly - 1 sets - 5 reps - 30 hold - Sleeper Stretch  - 2 x daily - 7 x weekly - 1 sets - 5 reps - 30 hold - Doorway Pec Stretch at 60 Elevation  - 1-2 x daily - 7 x weekly - 3 reps - 20 seconds hold - Seated Passive  Cervical Retraction  - 1-3 x daily - 7 x weekly - 2 sets - 10 reps - 5 seconds hold - Standing Row with Anchored Resistance  - 1-2 x daily - 7 x weekly - 2 sets - 10 reps - 3 seconds hold - Shoulder External Rotation Reactive Isometrics  - 1-2 x daily - 7 x weekly - 10 reps - Shoulder Internal Rotation Reactive Isometrics  - 1-2 x daily - 7 x weekly - 10 reps - Shoulder Extension with Resistance  - 1-2 x daily - 7 x weekly - 1-2 sets - 10-15 reps - Wall Push Up with Plus  - 1 x daily - 7 x weekly - 1-2 sets - 10-15 reps  ASSESSMENT:  CLINICAL IMPRESSION: At this time, Pt had reported reduced pain symptoms, improved mobility and use of arm as noted in subjective reporting and  patient specific functional scale reporting.  At this time, good knowledge of HEP was noted. Recommended discharge to HEP at this time with patient in agreement.    OBJECTIVE IMPAIRMENTS: decreased activity tolerance, decreased endurance, decreased knowledge of condition, decreased ROM, decreased strength, increased edema, impaired flexibility, postural dysfunction, and pain.   ACTIVITY LIMITATIONS: carrying, lifting, sleeping, bathing, dressing, reach over head, and hygiene/grooming  PARTICIPATION LIMITATIONS: meal prep and occupation  PERSONAL FACTORS: 1-2 comorbidities: see PMH are also affecting patient's functional outcome.   REHAB POTENTIAL: Good  CLINICAL DECISION MAKING: Stable/uncomplicated  EVALUATION COMPLEXITY: Low   GOALS: Goals reviewed with patient? Yes  SHORT TERM GOALS: (target date for Short term goals 11/12/2023)  1.Patient will demonstrate independent use of home exercise program to maintain progress from in clinic treatments. Baseline: SEE OBJECTIVE DATA Goal status: Met  LONG TERM GOALS: (target dates for all long term goals 12/16/2023 )   1. Patient will demonstrate/report pain at worst less than or equal to 2/10 to facilitate minimal limitation in daily activity secondary to pain  symptoms. Baseline: SEE OBJECTIVE DATA Goal status: Mostly met 11/30/2023   2. Patient will demonstrate independent use of home exercise program to facilitate ability to maintain/progress functional gains from skilled physical therapy services. Baseline: SEE OBJECTIVE DATA Goal status: Met 11/30/2023   3.  Patient reports Patient-Specific Activity Score improved the average by 3 to indicate improvement in functional activities.  Baseline: SEE OBJECTIVE DATA Goal status: Met 11/12/2023   4.  Patient will demonstrate left UE MMT 5/5 throughout to facilitate lifting, reaching, carrying at [facility] in daily activity.  Baseline: SEE OBJECTIVE DATA Goal status: Met 11/12/2023  5.  Patient will demonstrate left GH joint AROM WFL s symptoms to facilitate usual overhead reaching, self care, dressing at PLOF.  Baseline: SEE OBJECTIVE DATA Goal status: mostly met 11/30/2023      PLAN: PT FREQUENCY: 1-2x/week  PT DURATION: 8 weeks  PLANNED INTERVENTIONS: 97164- PT Re-evaluation, 97110-Therapeutic exercises, 97530- Therapeutic activity, 97112- Neuromuscular re-education, 97535- Self Care, 16109- Manual therapy, [id number]- Electrical stimulation (unattended), [id number]- Electrical stimulation (manual), 97016- Vasopneumatic device, [id number]- Ionotophoresis 4mg /ml Dexamethasone , Patient/Family education, Taping, Dry Needling, Joint mobilization, Cryotherapy, and Moist heat  PLAN FOR NEXT SESSION:  Discharge to HEP   Bonna Bustard, PT, DPT, OCS, ATC 11/30/23  8:40 AM

## 2023-12-07 ENCOUNTER — Encounter: Admitting: Rehabilitative and Restorative Service Providers"

## 2023-12-14 ENCOUNTER — Encounter: Admitting: Rehabilitative and Restorative Service Providers"

## 2023-12-15 ENCOUNTER — Encounter: Payer: Self-pay | Admitting: Orthopaedic Surgery

## 2023-12-15 ENCOUNTER — Ambulatory Visit: Admitting: Orthopaedic Surgery

## 2023-12-15 DIAGNOSIS — M25512 Pain in left shoulder: Secondary | ICD-10-CM

## 2023-12-15 DIAGNOSIS — G8929 Other chronic pain: Secondary | ICD-10-CM

## 2023-12-15 DIAGNOSIS — M7542 Impingement syndrome of left shoulder: Secondary | ICD-10-CM

## 2023-12-15 NOTE — Progress Notes (Signed)
 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  He reports that he is doing great and has no pain and much improved range of motion.  His only limitation motion is with the right shoulder reaching behind him.  He however is much better in terms of how high he can reach and its almost at the same level of his right side.  He has excellent strength of his shoulder and no significant signs of pain or impingement.  At this point follow-up can be as needed since he is doing so well.  He understands that we can always place a steroid injection in this area again if the pain comes back or flares up on him.  All questions and concerns were addressed and answered.

## 2024-04-24 ENCOUNTER — Encounter: Payer: Self-pay | Admitting: Radiology

## 2024-08-07 ENCOUNTER — Encounter: Admitting: Orthopaedic Surgery
